# Patient Record
Sex: Male | Born: 1965 | Race: White | Hispanic: No | Marital: Married | Smoking: Never smoker
Health system: Southern US, Community
[De-identification: ages and names within clinical notes are randomized; demographics above are authoritative.]

## PROBLEM LIST (undated history)

## (undated) DIAGNOSIS — K8020 Calculus of gallbladder without cholecystitis without obstruction: Secondary | ICD-10-CM

## (undated) DIAGNOSIS — M1990 Unspecified osteoarthritis, unspecified site: Secondary | ICD-10-CM

## (undated) DIAGNOSIS — K219 Gastro-esophageal reflux disease without esophagitis: Secondary | ICD-10-CM

## (undated) DIAGNOSIS — I10 Essential (primary) hypertension: Secondary | ICD-10-CM

## (undated) HISTORY — PX: OTHER SURGICAL HISTORY: SHX169

## (undated) HISTORY — DX: Calculus of gallbladder without cholecystitis without obstruction: K80.20

## (undated) HISTORY — PX: HERNIA REPAIR: SHX51

---

## 2000-01-26 ENCOUNTER — Emergency Department (HOSPITAL_COMMUNITY): Admission: EM | Admit: 2000-01-26 | Discharge: 2000-01-26 | Payer: Self-pay | Admitting: Emergency Medicine

## 2000-01-26 ENCOUNTER — Encounter: Payer: Self-pay | Admitting: Emergency Medicine

## 2001-11-25 ENCOUNTER — Emergency Department (HOSPITAL_COMMUNITY): Admission: EM | Admit: 2001-11-25 | Discharge: 2001-11-25 | Payer: Self-pay | Admitting: Unknown Physician Specialty

## 2013-12-20 ENCOUNTER — Emergency Department (HOSPITAL_COMMUNITY)
Admission: EM | Admit: 2013-12-20 | Discharge: 2013-12-20 | Disposition: A | Discharge status: Home / Self Care | Payer: Medicaid Other | Attending: Emergency Medicine | Admitting: Emergency Medicine

## 2013-12-20 ENCOUNTER — Encounter (HOSPITAL_COMMUNITY): Payer: Self-pay | Admitting: Emergency Medicine

## 2013-12-20 DIAGNOSIS — Y9389 Activity, other specified: Secondary | ICD-10-CM | POA: Insufficient documentation

## 2013-12-20 DIAGNOSIS — T148XXA Other injury of unspecified body region, initial encounter: Secondary | ICD-10-CM

## 2013-12-20 DIAGNOSIS — X500XXA Overexertion from strenuous movement or load, initial encounter: Secondary | ICD-10-CM | POA: Insufficient documentation

## 2013-12-20 DIAGNOSIS — IMO0002 Reserved for concepts with insufficient information to code with codable children: Secondary | ICD-10-CM | POA: Insufficient documentation

## 2013-12-20 DIAGNOSIS — I10 Essential (primary) hypertension: Secondary | ICD-10-CM | POA: Insufficient documentation

## 2013-12-20 DIAGNOSIS — Y929 Unspecified place or not applicable: Secondary | ICD-10-CM | POA: Insufficient documentation

## 2013-12-20 HISTORY — DX: Essential (primary) hypertension: I10

## 2013-12-20 MED ORDER — IBUPROFEN 800 MG PO TABS: 800.0000 mg | ORAL_TABLET | Freq: Three times a day (TID) | ORAL | Status: DC

## 2013-12-20 MED ORDER — OXYCODONE-ACETAMINOPHEN 5-325 MG PO TABS: 1.0000 | ORAL_TABLET | ORAL | Status: DC | PRN

## 2013-12-20 MED ORDER — IBUPROFEN 800 MG PO TABS
800.0000 mg | ORAL_TABLET | Freq: Once | ORAL | Status: AC
  Administered 2013-12-20: 800 mg via ORAL
  Filled 2013-12-20: qty 1

## 2013-12-20 MED ORDER — OXYCODONE-ACETAMINOPHEN 5-325 MG PO TABS
2.0000 | ORAL_TABLET | Freq: Once | ORAL | Status: AC
  Administered 2013-12-20: 2 via ORAL
  Filled 2013-12-20: qty 2

## 2013-12-20 NOTE — ED Notes (Signed)
Pt was pushing his truck today and injury right lower leg, "heard a pop", thinks it may be broken.

## 2013-12-20 NOTE — ED Provider Notes (Signed)
CSN: [id number]     Arrival date & time 12/20/13  0709 History   First MD Initiated Contact with Patient 12/20/13 0827     Chief Complaint  Patient presents with  . Leg Injury   (Consider location/radiation/quality/duration/timing/severity/associated sxs/prior Treatment) HPI Comments: Mark Warner is a 48 y.o. male who presents to the Emergency Department complaining of sudden onset of pain to the right lower leg that began just prior to ED arrival.  Patient states that he was pushing his truck and felt a sharp pain and a "pop" to his right calf.  Reports pain and inability to bear weight on his right leg.  Pain is worse with dorsiflexion of the right foot.  He denies fall, redness, numbness, weakness, knee or hip pain.    The history is provided by the patient.    Past Medical History  Diagnosis Date  . Hypertension    Past Surgical History  Procedure Laterality Date  . Hernia repair     No family history on file. History  Substance Use Topics  . Smoking status: Never Smoker   . Smokeless tobacco: Not on file  . Alcohol Use: No    Review of Systems  Constitutional: Negative for fever and chills.  Gastrointestinal: Negative for nausea, vomiting and abdominal pain.  Genitourinary: Negative for dysuria and difficulty urinating.  Musculoskeletal: Positive for arthralgias, gait problem and myalgias. Negative for back pain and joint swelling.  Skin: Negative for color change and wound.  Neurological: Negative for dizziness, syncope, speech difficulty, weakness and light-headedness.  All other systems reviewed and are negative.    Allergies  Review of patient's allergies indicates no known allergies.  Home Medications  No current outpatient prescriptions on file. BP 157/89  Pulse 68  Temp(Src) 98.3 F (36.8 C) (Oral)  Resp 18  Ht 5\' 9"  (1.753 m)  Wt 240 lb (108.863 kg)  BMI 35.43 kg/m2  SpO2 98%   Physical Exam  Nursing note and vitals reviewed. Constitutional:  He is oriented to person, place, and time. He appears well-developed and well-nourished. No distress.  HENT:  Head: Normocephalic and atraumatic.  Cardiovascular: Normal rate, regular rhythm, normal heart sounds and intact distal pulses.   No murmur heard. Pulmonary/Chest: Effort normal and breath sounds normal. No respiratory distress. He exhibits no tenderness.  Abdominal: Soft. Bowel sounds are normal. He exhibits no distension. There is no tenderness.  Musculoskeletal: He exhibits edema and tenderness.       Right lower leg: He exhibits tenderness and swelling. He exhibits no bony tenderness, no deformity and no laceration.       Legs: ttp of the medial aspect of the right lower leg.  Mild edema present.  Thompson test negative.  DP and PT pulse are brisk, distal sensation intact.  CR< 3 sec  Neurological: He is alert and oriented to person, place, and time. He exhibits normal muscle tone. Coordination normal.  Skin: Skin is warm and dry. No rash noted.    ED Course  Procedures (including critical care time) Labs Review Labs Reviewed - No data to display Imaging Review No results found.  EKG Interpretation   None       MDM    Localized ttp of the right medial calf.  Probable muscle injury of the soleus vs gastrocnemius.  Negative Thompson test, no step off deformity or tenderness of the Achilles tendon.  9:29 AM Consulted Dr. Romeo Apple, advised to give pt crutches, and he will see him in his  office for follow-up.  Discussed plan with patient, agrees to f/u.  Will prescribe ibuprofen and percocet #24  Nivaan Dicenzo L. Trisha Mangleriplett, PA-C 12/21/13 [phone]

## 2013-12-22 NOTE — ED Provider Notes (Signed)
Medical screening examination/treatment/procedure(s) were performed by non-physician practitioner and as supervising physician I was immediately available for consultation/collaboration.  EKG Interpretation   None         Adelyne Marchese L Brycelyn Gambino, MD 12/22/13 0832 

## 2013-12-25 ENCOUNTER — Telehealth: Payer: Self-pay | Admitting: Orthopedic Surgery

## 2013-12-25 NOTE — Telephone Encounter (Addendum)
Patient (wife) called to request appointment following Emergency Room visit at [facility] on 12/20/13 for problem of leg injury, states from pushing a vehicle. Reviewed appointment information and insurance requirements, which include referral from primary care physician (Dr [facility]ard at [facility]).  Will contact Primary care to request referral appointment.

## 2014-01-03 NOTE — Telephone Encounter (Signed)
Called patient to follow up; spoke with patient/wife; states [place] insurance "is messed up"; patient is working with [employer] and insurance, as well as primary care office on card, which [facility] (patient has never been seen at this office; also, patient has been told [facility], based on their location.  States will follow up with us after the insurance "has it straightened out."

## 2017-11-24 ENCOUNTER — Encounter (HOSPITAL_COMMUNITY): Payer: Self-pay

## 2017-11-24 ENCOUNTER — Emergency Department (HOSPITAL_COMMUNITY)
Admission: EM | Admit: 2017-11-24 | Discharge: 2017-11-24 | Disposition: A | Discharge status: Home / Self Care | Payer: Self-pay | Attending: Emergency Medicine | Admitting: Emergency Medicine

## 2017-11-24 ENCOUNTER — Other Ambulatory Visit: Payer: Self-pay

## 2017-11-24 DIAGNOSIS — I10 Essential (primary) hypertension: Secondary | ICD-10-CM | POA: Insufficient documentation

## 2017-11-24 DIAGNOSIS — K0889 Other specified disorders of teeth and supporting structures: Secondary | ICD-10-CM | POA: Insufficient documentation

## 2017-11-24 MED ORDER — IBUPROFEN 800 MG PO TABS
800.0000 mg | ORAL_TABLET | Freq: Once | ORAL | Status: AC
  Administered 2017-11-24: 800 mg via ORAL
  Filled 2017-11-24: qty 1

## 2017-11-24 MED ORDER — PENICILLIN V POTASSIUM 500 MG PO TABS: 500.0000 mg | ORAL_TABLET | Freq: Four times a day (QID) | ORAL | 0 refills | Status: AC

## 2017-11-24 MED ORDER — PENICILLIN V POTASSIUM 250 MG PO TABS
500.0000 mg | ORAL_TABLET | Freq: Once | ORAL | Status: AC
  Administered 2017-11-24: 500 mg via ORAL
  Filled 2017-11-24: qty 2

## 2017-11-24 MED ORDER — IBUPROFEN 600 MG PO TABS: 600.0000 mg | ORAL_TABLET | Freq: Four times a day (QID) | ORAL | 0 refills | Status: DC | PRN

## 2017-11-24 NOTE — ED Triage Notes (Signed)
Pt arrives from home c/o left dental pain radiating to his head. States it is an 8 (0-10 scale). Pt reports legs were shaky earlier and states the pain is worse while lying down.

## 2017-11-24 NOTE — ED Provider Notes (Signed)
[facility] EMERGENCY DEPARTMENT Provider Note   CSN: [id number] Arrival date & time: 11/24/17  0516     History   Chief Complaint Chief Complaint  Patient presents with  . Dental Pain    HPI Mark BilletRobert J Warner is a 51 y.o. male.  The history is provided by the patient and the spouse.  Dental Pain   This is a new problem. The current episode started 6 to 12 hours ago. The problem occurs constantly. The problem has been gradually worsening. The pain is moderate.   Patient reports onset of left lower jaw dental pain several hours ago Since that time the pain has been shooting up in the left side of his face and head He reports he took a Vicodin as well as a shot of bourbon for the pain but this did not help After taking the Vicodin and bourbon, he started feeling "shaky "in his legs and said he felt his whole body was cold Says he never takes pain medicine  Currently says he still has pain in his jaw as well as head There is no fever/vomiting,no visual changes, no chest pain, no shortness of breath Past Medical History:  Diagnosis Date  . Hypertension     There are no active problems to display for this patient.   Past Surgical History:  Procedure Laterality Date  . HERNIA REPAIR         Home Medications    Prior to Admission medications   Medication Sig Start Date End Date Taking? Authorizing Provider  ibuprofen (ADVIL,MOTRIN) 600 MG tablet Take 1 tablet (600 mg total) by mouth every 6 (six) hours as needed. 11/24/17   Zadie RhineWickline, Narciso Stoutenburg, MD  penicillin v potassium (VEETID) 500 MG tablet Take 1 tablet (500 mg total) by mouth 4 (four) times daily for 7 days. 11/24/17 12/01/17  Zadie RhineWickline, Anayia Eugene, MD    Family History No family history on file.  Social History Social History   Tobacco Use  . Smoking status: Never Smoker  Substance Use Topics  . Alcohol use: No  . Drug use: No     Allergies   Patient has no known allergies.   Review of Systems Review of  Systems  Constitutional: Negative for fever.  HENT: Positive for dental problem. Negative for trouble swallowing.   Eyes: Negative for visual disturbance.  Respiratory: Negative for shortness of breath.   Cardiovascular: Negative for chest pain.     Physical Exam Updated Vital Signs BP (!) 172/96 (BP Location: Right Arm)   Pulse 65   Temp 97.8 F (36.6 C) (Oral)   Resp 16   Ht 1.753 m (5\' 9" )   Wt 108.9 kg (240 lb)   SpO2 96%   BMI 35.44 kg/m   Physical Exam CONSTITUTIONAL: Well developed/well nourished HEAD AND FACE: Normocephalic/atraumatic EYES: EOMI/PERRL, no proptosis ENMT: Mucous membranes moist.  Poor dentition.  No trismus.  No focal abscess noted.  Tenderness to left lower teeth, no gingival edema bilateral TMs clear and intact NECK: supple no meningeal signs CV: S1/S2 noted, no murmurs/rubs/gallops noted LUNGS: Lungs are clear to auscultation bilaterally, no apparent distress ABDOMEN: soft, nontender, no rebound or guarding NEURO: Pt is awake/alert, moves all extremitiesx4 EXTREMITIES:full ROM SKIN: warm, color normal   ED Treatments / Results  Labs (all labs ordered are listed, but only abnormal results are displayed) Labs Reviewed - No data to display  EKG  EKG Interpretation None       Radiology No results found.  Procedures  Procedures (including critical care time)  Medications Ordered in ED Medications  penicillin v potassium (VEETID) tablet 500 mg (not administered)  ibuprofen (ADVIL,MOTRIN) tablet 800 mg (not administered)     Initial Impression / Assessment and Plan / ED Course  I have reviewed the triage vital signs and the nursing notes.      Patient here with dental pain that started last night Suspect his episode of feeling cold and "shaky legs "were related to Vicodin and bourbon Start penicillin and will start ibuprofen and referred to dentistry  Final Clinical Impressions(s) / ED Diagnoses   Final diagnoses:  Pain,  dental    ED Discharge Orders        Ordered    ibuprofen (ADVIL,MOTRIN) 600 MG tablet  Every 6 hours PRN     11/24/17 0614    penicillin v potassium (VEETID) 500 MG tablet  4 times daily     11/24/17 [id number]       Zadie RhineWickline, Kimothy Kishimoto, MD 11/24/17 [phone]

## 2018-03-23 ENCOUNTER — Emergency Department (HOSPITAL_COMMUNITY)
Admission: EM | Admit: 2018-03-23 | Discharge: 2018-03-23 | Disposition: A | Discharge status: Home / Self Care | Payer: Self-pay | Attending: Emergency Medicine | Admitting: Emergency Medicine

## 2018-03-23 ENCOUNTER — Other Ambulatory Visit: Payer: Self-pay

## 2018-03-23 ENCOUNTER — Encounter (HOSPITAL_COMMUNITY): Payer: Self-pay | Admitting: Emergency Medicine

## 2018-03-23 ENCOUNTER — Emergency Department (HOSPITAL_COMMUNITY): Payer: Self-pay

## 2018-03-23 DIAGNOSIS — I10 Essential (primary) hypertension: Secondary | ICD-10-CM | POA: Insufficient documentation

## 2018-03-23 DIAGNOSIS — Z79899 Other long term (current) drug therapy: Secondary | ICD-10-CM | POA: Insufficient documentation

## 2018-03-23 DIAGNOSIS — N1330 Unspecified hydronephrosis: Secondary | ICD-10-CM | POA: Insufficient documentation

## 2018-03-23 DIAGNOSIS — K8020 Calculus of gallbladder without cholecystitis without obstruction: Secondary | ICD-10-CM | POA: Insufficient documentation

## 2018-03-23 DIAGNOSIS — E876 Hypokalemia: Secondary | ICD-10-CM | POA: Insufficient documentation

## 2018-03-23 DIAGNOSIS — K5790 Diverticulosis of intestine, part unspecified, without perforation or abscess without bleeding: Secondary | ICD-10-CM | POA: Insufficient documentation

## 2018-03-23 LAB — URINALYSIS, ROUTINE W REFLEX MICROSCOPIC
BACTERIA UA: NONE SEEN
BILIRUBIN URINE: NEGATIVE
Glucose, UA: NEGATIVE mg/dL
KETONES UR: NEGATIVE mg/dL
LEUKOCYTES UA: NEGATIVE
Nitrite: NEGATIVE
Protein, ur: NEGATIVE mg/dL
Specific Gravity, Urine: 1.009 (ref 1.005–1.030)
Squamous Epithelial / LPF: NONE SEEN
pH: 6 (ref 5.0–8.0)

## 2018-03-23 LAB — CBC WITH DIFFERENTIAL/PLATELET
BASOS ABS: 0 10*3/uL (ref 0.0–0.1)
Basophils Relative: 0 %
Eosinophils Absolute: 0.2 10*3/uL (ref 0.0–0.7)
Eosinophils Relative: 2 %
HCT: 44.8 % (ref 39.0–52.0)
HEMOGLOBIN: 14.9 g/dL (ref 13.0–17.0)
LYMPHS ABS: 2 10*3/uL (ref 0.7–4.0)
LYMPHS PCT: 22 %
MCH: 29 pg (ref 26.0–34.0)
MCHC: 33.3 g/dL (ref 30.0–36.0)
MCV: 87.2 fL (ref 78.0–100.0)
Monocytes Absolute: 0.8 10*3/uL (ref 0.1–1.0)
Monocytes Relative: 8 %
NEUTROS PCT: 68 %
Neutro Abs: 6 10*3/uL (ref 1.7–7.7)
Platelets: 134 10*3/uL — ABNORMAL LOW (ref 150–400)
RBC: 5.14 MIL/uL (ref 4.22–5.81)
RDW: 13.1 % (ref 11.5–15.5)
WBC: 8.9 10*3/uL (ref 4.0–10.5)

## 2018-03-23 LAB — COMPREHENSIVE METABOLIC PANEL
ALBUMIN: 4.1 g/dL (ref 3.5–5.0)
ALT: 27 U/L (ref 17–63)
ANION GAP: 7 (ref 5–15)
AST: 23 U/L (ref 15–41)
Alkaline Phosphatase: 71 U/L (ref 38–126)
BUN: 16 mg/dL (ref 6–20)
CO2: 26 mmol/L (ref 22–32)
Calcium: 9.2 mg/dL (ref 8.9–10.3)
Chloride: 104 mmol/L (ref 101–111)
Creatinine, Ser: 0.96 mg/dL (ref 0.61–1.24)
GFR calc non Af Amer: >60 mL/min (ref >60)
GLUCOSE: 168 mg/dL — AB (ref 65–99)
POTASSIUM: 3.3 mmol/L — AB (ref 3.5–5.1)
SODIUM: 137 mmol/L (ref 135–145)
Total Bilirubin: 0.5 mg/dL (ref 0.3–1.2)
Total Protein: 7.3 g/dL (ref 6.5–8.1)

## 2018-03-23 LAB — TROPONIN I

## 2018-03-23 LAB — LIPASE, BLOOD: Lipase: 29 U/L (ref 11–51)

## 2018-03-23 MED ORDER — ONDANSETRON HCL 4 MG/2ML IJ SOLN
4.0000 mg | Freq: Once | INTRAMUSCULAR | Status: AC
  Administered 2018-03-23: 4 mg via INTRAVENOUS
  Filled 2018-03-23: qty 2

## 2018-03-23 MED ORDER — KETOROLAC TROMETHAMINE 30 MG/ML IJ SOLN
30.0000 mg | Freq: Once | INTRAMUSCULAR | Status: AC
  Administered 2018-03-23: 30 mg via INTRAVENOUS
  Filled 2018-03-23: qty 1

## 2018-03-23 MED ORDER — POTASSIUM CHLORIDE CRYS ER 20 MEQ PO TBCR: 20.0000 meq | EXTENDED_RELEASE_TABLET | Freq: Two times a day (BID) | ORAL | 0 refills | Status: DC

## 2018-03-23 NOTE — ED Triage Notes (Signed)
Pt c/o left lower abd pain that radiates to testicles at times. Pt states pain woke him up out of sleep.

## 2018-03-23 NOTE — ED Provider Notes (Signed)
[facility] EMERGENCY DEPARTMENT Provider Note   CSN: [id number] Arrival date & time: 03/23/18  0428  Time seen 04:45 AM   History   Chief Complaint Chief Complaint  Patient presents with  . Flank Pain    HPI Albina BilletRobert J Windle is a 52 y.o. male.  HPI patient states he was sleeping tonight and was having a lot of heartburn and points to the center of his chest.  He states he ate pizza and cookies tonight which he does not normally eat.  He states about 2 AM he was awakened with severe pain in his left lateral abdomen that radiated into his left testicle that he described as a pressure feeling in the testicle.  The pain in the eighth his abdomen was sharp and continuous.  He felt the urge to urinate continuously.  He denies any hematuria.  He states movement makes the pain worse, nothing made it feel better.  He does state however when he arrived in the ED the bad pain eased up and he now just has mild discomfort.  He denies nausea or vomiting.  He states he has been having this pain before and states he had the pain when he was seen in the ED in December however it looks to get that noted states he had dental pain.  PCP Patient, No Pcp Per   Past Medical History:  Diagnosis Date  . Hypertension     There are no active problems to display for this patient.   Past Surgical History:  Procedure Laterality Date  . HERNIA REPAIR          Home Medications    Prior to Admission medications   Medication Sig Start Date End Date Taking? Authorizing Provider  ibuprofen (ADVIL,MOTRIN) 600 MG tablet Take 1 tablet (600 mg total) by mouth every 6 (six) hours as needed. 11/24/17   Zadie RhineWickline, Donald, MD  potassium chloride SA (K-DUR,KLOR-CON) 20 MEQ tablet Take 1 tablet (20 mEq total) by mouth 2 (two) times daily. 03/23/18   Devoria AlbeKnapp, Vikkie Goeden, MD    Family History No family history on file.  Social History Social History   Tobacco Use  . Smoking status: Never Smoker  . Smokeless tobacco:  Never Used  Substance Use Topics  . Alcohol use: No  . Drug use: No  employed Lives with spouse   Allergies   Patient has no known allergies.   Review of Systems Review of Systems  All other systems reviewed and are negative.    Physical Exam Updated Vital Signs BP (!) 155/93 (BP Location: Right Arm)   Pulse 60   Temp (!) 97.4 F (36.3 C) (Oral)   Resp 16   Ht 5\' 9"  (1.753 m)   Wt 104.3 kg (230 lb)   SpO2 98%   BMI 33.97 kg/m   Physical Exam  Constitutional: He is oriented to person, place, and time. He appears well-developed and well-nourished.  Non-toxic appearance. He does not appear ill. No distress.  HENT:  Head: Normocephalic and atraumatic.  Right Ear: External ear normal.  Left Ear: External ear normal.  Nose: Nose normal. No mucosal edema or rhinorrhea.  Mouth/Throat: Oropharynx is clear and moist and mucous membranes are normal. No dental abscesses or uvula swelling.  Eyes: Pupils are equal, round, and reactive to light. Conjunctivae and EOM are normal.  Neck: Normal range of motion and full passive range of motion without pain. Neck supple.  Cardiovascular: Normal rate, regular rhythm and normal heart sounds. Exam  reveals no gallop and no friction rub.  No murmur heard. Pulmonary/Chest: Effort normal and breath sounds normal. No respiratory distress. He has no wheezes. He has no rhonchi. He has no rales. He exhibits no tenderness and no crepitus.  Abdominal: Soft. Normal appearance and bowel sounds are normal. He exhibits no distension. There is no tenderness. There is no rebound and no guarding.    Area of pain noted  Musculoskeletal: Normal range of motion. He exhibits no edema or tenderness.  Moves all extremities well.   Neurological: He is alert and oriented to person, place, and time. He has normal strength. No cranial nerve deficit.  Skin: Skin is warm, dry and intact. No rash noted. No erythema. No pallor.  Psychiatric: He has a normal mood and  affect. His speech is normal and behavior is normal. His mood appears not anxious.  Nursing note and vitals reviewed.    ED Treatments / Results  Labs (all labs ordered are listed, but only abnormal results are displayed) Results for orders placed or performed during the hospital encounter of 03/23/18  Urinalysis, Routine w reflex microscopic- may I&O cath if menses  Result Value Ref Range   Color, Urine YELLOW YELLOW   APPearance CLEAR CLEAR   Specific Gravity, Urine 1.009 1.005 - 1.030   pH 6.0 5.0 - 8.0   Glucose, UA NEGATIVE NEGATIVE mg/dL   Hgb urine dipstick LARGE (A) NEGATIVE   Bilirubin Urine NEGATIVE NEGATIVE   Ketones, ur NEGATIVE NEGATIVE mg/dL   Protein, ur NEGATIVE NEGATIVE mg/dL   Nitrite NEGATIVE NEGATIVE   Leukocytes, UA NEGATIVE NEGATIVE   RBC / HPF 6-30 0 - 5 RBC/hpf   WBC, UA 0-5 0 - 5 WBC/hpf   Bacteria, UA NONE SEEN NONE SEEN   Squamous Epithelial / LPF NONE SEEN NONE SEEN   Mucus PRESENT   Comprehensive metabolic panel  Result Value Ref Range   Sodium 137 135 - 145 mmol/L   Potassium 3.3 (L) 3.5 - 5.1 mmol/L   Chloride 104 101 - 111 mmol/L   CO2 26 22 - 32 mmol/L   Glucose, Bld 168 (H) 65 - 99 mg/dL   BUN 16 6 - 20 mg/dL   Creatinine, Ser 2.95 0.61 - 1.24 mg/dL   Calcium 9.2 8.9 - 62.1 mg/dL   Total Protein 7.3 6.5 - 8.1 g/dL   Albumin 4.1 3.5 - 5.0 g/dL   AST 23 15 - 41 U/L   ALT 27 17 - 63 U/L   Alkaline Phosphatase 71 38 - 126 U/L   Total Bilirubin 0.5 0.3 - 1.2 mg/dL   GFR calc non Af Amer >60 >60 mL/min   GFR calc Af Amer >60 >60 mL/min   Anion gap 7 5 - 15  Lipase, blood  Result Value Ref Range   Lipase 29 11 - 51 U/L  Troponin I  Result Value Ref Range   Troponin I <0.03 <0.03 ng/mL  CBC with Differential  Result Value Ref Range   WBC 8.9 4.0 - 10.5 K/uL   RBC 5.14 4.22 - 5.81 MIL/uL   Hemoglobin 14.9 13.0 - 17.0 g/dL   HCT 30.8 65.7 - 84.6 %   MCV 87.2 78.0 - 100.0 fL   MCH 29.0 26.0 - 34.0 pg   MCHC 33.3 30.0 - 36.0 g/dL    RDW 96.2 95.2 - 84.1 %   Platelets 134 (L) 150 - 400 K/uL   Neutrophils Relative % 68 %   Neutro Abs 6.0 1.7 -  7.7 K/uL   Lymphocytes Relative 22 %   Lymphs Abs 2.0 0.7 - 4.0 K/uL   Monocytes Relative 8 %   Monocytes Absolute 0.8 0.1 - 1.0 K/uL   Eosinophils Relative 2 %   Eosinophils Absolute 0.2 0.0 - 0.7 K/uL   Basophils Relative 0 %   Basophils Absolute 0.0 0.0 - 0.1 K/uL   Laboratory interpretation all normal except hematuria and mild hypokalemia    EKG EKG Interpretation  Date/Time:  Wednesday March 23 2018 05:16:11 EDT Ventricular Rate:  54 PR Interval:    QRS Duration: 100 QT Interval:  455 QTC Calculation: 432 R Axis:   75 Text Interpretation:  Sinus bradycardia U waves present Otherwise within normal limits No old tracing to compare Confirmed by Devoria Albe ([id number]) on 03/23/2018 5:24:28 AM   Radiology Ct Renal Stone Study  Result Date: 03/23/2018 CLINICAL DATA:  Left flank pain. EXAM: CT ABDOMEN AND PELVIS WITHOUT CONTRAST TECHNIQUE: Multidetector CT imaging of the abdomen and pelvis was performed following the standard protocol without IV contrast. COMPARISON:  None. FINDINGS: Lower chest: Minimal lingular atelectasis.  No consolidation. Hepatobiliary: Decreased hepatic density consistent with steatosis. Focal fatty sparing adjacent the gallbladder fossa. 2 peripherally calcified gallstones within the gallbladder without pericholecystic inflammation or gallbladder wall thickening. No biliary dilatation. Pancreas: Few calcifications in the distal pancreatic body and tail. No ductal dilatation or inflammation. Spleen: Mild splenomegaly with spleen measuring 15.3 cm AP. Adrenals/Urinary Tract: Mild left adrenal thickening. No adrenal nodule. Mild left hydroureteronephrosis, the ureter is dilated to the urinary bladder. No calcified stone or cause for obstruction. Mild left perinephric edema. No right hydronephrosis or hydroureter. No nephrolithiasis. Urinary bladder is  partially distended without stone or wall thickening. No urethral stones. Stomach/Bowel: Stomach distended with fluid/ingested contents. No gastric wall thickening. No bowel obstruction, inflammatory change or wall thickening. Air and possible prior enteric contrast in the appendix without appendicitis. Moderate proximal colonic stool burden. Mild diverticulosis of the distal colon without diverticulitis. Vascular/Lymphatic: Mild aortic atherosclerosis without aneurysm. Few prominent portacaval nodes are likely reactive. There is a 1.5 x 2.0 cm soft tissue nodule in the left pericolic gutter, axial image 39 series 2. No retroperitoneal or pelvic adenopathy. Reproductive: Prostate is unremarkable. Other: Small fat containing umbilical hernia. Fat in both inguinal canals. No ascites or free air. Musculoskeletal: There are no acute or suspicious osseous abnormalities. IMPRESSION: 1. Mild left hydroureteronephrosis without visualized stone or cause for obstruction. Findings may be secondary to non radiopaque stone, recently passed stone or pyelonephritis. 2. Well defined soft tissue nodule in the left pericolic gutter measuring 1.5 x 2 cm. This may be a splenule as this is similar density to the spleen versus an enlarged lymph node. No other adenopathy in the abdomen or pelvis. No prior exams available for comparison. Recommend clinical and laboratory evaluation for lymphoproliferative disorder. In the absence of malignancy history, recommend follow-up CT in 3 months. 3. Incidental findings of cholelithiasis. Mild colonic diverticulosis. No diverticulitis. Aortic Atherosclerosis (ICD10-I70.0). Electronically Signed   By: Rubye Oaks M.D.   On: 03/23/2018 06:03    Procedures Procedures (including critical care time)  Medications Ordered in ED Medications  ondansetron (ZOFRAN) injection 4 mg (4 mg Intravenous Given 03/23/18 0510)  ketorolac (TORADOL) 30 MG/ML injection 30 mg (30 mg Intravenous Given 03/23/18  0510)     Initial Impression / Assessment and Plan / ED Course  I have reviewed the triage vital signs and the nursing notes.  Pertinent labs & imaging  results that were available during my care of the patient were reviewed by me and considered in my medical decision making (see chart for details).     Patient was given IV Zofran and ketorolac for his presumed kidney stone pain.  I had a laboratory testing to make sure he is not having inflammation of his gallbladder with his complaints of heartburn and also evaluate his heart.  EKG was also done.  At time of discharge she states his pain is gone.  We discussed his CT results.  It appears he has passed a kidney stone however there could be some scar tissue in the distal ureter that is causing the mild hydronephrosis.  We discussed the left gutter lymphadenopathy and it was just something to be aware of.  We also talked that he has gallstones.  At this point he does not want to talk to surgeon about having his gallbladder removed.  He states he will just avoid the foods that make him feel bad.  We also discussed he had diverticulosis and that he should start eating a high fiber diet.  We had already discussed earlier that he needs to drink sports drinks when he is working or sweating and this will help prevent him forming kidney stones.  Patient was discharged home with some potassium pills.  Final Clinical Impressions(s) / ED Diagnoses   Final diagnoses:  Gallstones  Diverticulosis  Hydronephrosis, unspecified hydronephrosis type  Hypokalemia    ED Discharge Orders        Ordered    potassium chloride SA (K-DUR,KLOR-CON) 20 MEQ tablet  2 times daily     03/23/18 0626      Plan discharge  Devoria Albe, MD, Concha Pyo, MD 03/23/18 [phone]

## 2018-03-23 NOTE — Discharge Instructions (Addendum)
The tube that drains her left kidney is slightly enlarged consistent with you having recently passed a kidney stone, or due to a slight blockage of the urine draining on that side.  Please follow-up with [facility].  You also do have gallstones.  You can avoid the things in your diet that make you have episodes of discomfort and heartburn.  However if it gets worse you might consider talking to a surgeon about having your gallbladder removed.  You also have diverticulosis, you should try to eat a high-fiber diet.  Try to drink sports drinks when you are outside working in the heat or you are getting sweaty, this will help you prevent formation of kidney stones.

## 2018-04-07 ENCOUNTER — Encounter: Payer: Self-pay | Admitting: Family Medicine

## 2018-04-07 ENCOUNTER — Encounter: Payer: Self-pay | Admitting: *Deleted

## 2018-04-07 ENCOUNTER — Ambulatory Visit: Payer: Self-pay | Admitting: Family Medicine

## 2018-04-07 VITALS — BP 160/90 | HR 62 | Temp 98.3°F | Resp 14 | Ht 69.0 in | Wt 228.0 lb

## 2018-04-07 DIAGNOSIS — R030 Elevated blood-pressure reading, without diagnosis of hypertension: Secondary | ICD-10-CM

## 2018-04-07 DIAGNOSIS — K8020 Calculus of gallbladder without cholecystitis without obstruction: Secondary | ICD-10-CM

## 2018-04-07 DIAGNOSIS — R1900 Intra-abdominal and pelvic swelling, mass and lump, unspecified site: Secondary | ICD-10-CM

## 2018-04-07 DIAGNOSIS — Z125 Encounter for screening for malignant neoplasm of prostate: Secondary | ICD-10-CM

## 2018-04-07 DIAGNOSIS — Z7689 Persons encountering health services in other specified circumstances: Secondary | ICD-10-CM

## 2018-04-07 NOTE — Progress Notes (Signed)
Subjective:    Patient ID: Mark BilletRobert J Sabet, male    DOB: August 15, 1966, 52 y.o.   MRN: [id number]  HPI  Patient is a very pleasant 52 year old Caucasian male here today to establish care.  Recently went to the emergency room after he developed severe right upper quadrant and left upper quadrant abdominal pain.  CT scan renal stone protocol was obtained which revealed cephalic gallstones in the right upper quadrant.  It also revealed mild left hydronephrosis suggesting possibly recent past left-sided nephrolithiasis.  There was also the coincidental finding of a 1.5 cm x 2 cm mass in the left paracolic gutter concerning for a lymph node versus splenule.  He was discharged home it is referred here today for treatment.  His left upper quadrant abdominal pain has since subsided and has not returned.  However he continues to have episodes of right upper quadrant abdominal pain.  It begins right below his right breast and radiates into his right shoulder blade area.  It tends to last hours and occur without provocation.  It resolves without any specific intervention.  Symptoms are concerning for gallstones.  His blood pressure today is elevated at 160/90 however he states that he is nervous.  He has never been told he has high blood pressure.  He denies any chest pain shortness of breath or dyspnea on exertion.  He is overdue for a colonoscopy.  He is overdue for prostate cancer screening.  He is overdue for fasting lab work. No past medical history on file. Past Surgical History:  Procedure Laterality Date  . HERNIA REPAIR     He takes no medication.  He has no known drug allergies. Social History   Socioeconomic History  . Marital status: Married    Spouse name: Not on file  . Number of children: Not on file  . Years of education: Not on file  . Highest education level: Not on file  Occupational History  . Not on file  Social Needs  . Financial resource strain: Not on file  . Food insecurity:    Worry: Not on file    Inability: Not on file  . Transportation needs:    Medical: Not on file    Non-medical: Not on file  Tobacco Use  . Smoking status: Never Smoker  . Smokeless tobacco: Never Used  Substance and Sexual Activity  . Alcohol use: No  . Drug use: No  . Sexual activity: Yes  Lifestyle  . Physical activity:    Days per week: Not on file    Minutes per session: Not on file  . Stress: Not on file  Relationships  . Social connections:    Talks on phone: Not on file    Gets together: Not on file    Attends religious service: Not on file    Active member of club or organization: Not on file    Attends meetings of clubs or organizations: Not on file    Relationship status: Not on file  . Intimate partner violence:    Fear of current or ex partner: Not on file    Emotionally abused: Not on file    Physically abused: Not on file    Forced sexual activity: Not on file  Other Topics Concern  . Not on file  Social History Narrative  . Not on file   No family history on file. Patient is uncertain about family history.  He believes his father had high blood pressure and heart disease.  He is uncertain about his mother.  He denies any history of colon cancer or prostate cancer Review of Systems  Constitutional: Negative.  Negative for chills, diaphoresis, fatigue, fever and unexpected weight change.  All other systems reviewed and are negative.      Objective:   Physical Exam  Constitutional: He appears well-developed and well-nourished. No distress.  HENT:  Head: Normocephalic and atraumatic.  Mouth/Throat: Oropharynx is clear and moist.  Eyes: No scleral icterus.  Neck: Neck supple. No JVD present.  Cardiovascular: Normal rate, regular rhythm and normal heart sounds.  No murmur heard. Pulmonary/Chest: Effort normal and breath sounds normal. No respiratory distress. He has no wheezes. He has no rales. He exhibits no tenderness.  Abdominal: Soft. Bowel sounds  are normal. He exhibits no distension and no mass. There is no tenderness. There is no rebound and no guarding.  Lymphadenopathy:    He has no cervical adenopathy.  Skin: He is not diaphoretic.  Vitals reviewed.         Assessment & Plan:  Encounter to establish care with new doctor - Plan: CBC with Differential/Platelet, COMPLETE METABOLIC PANEL WITH GFR, Lipid panel, PSA  Calculus of gallbladder without cholecystitis without obstruction - Plan: Ambulatory referral to General Surgery  Abdominal mass, unspecified abdominal location  Single episode of elevated blood pressure - Plan: CBC with Differential/Platelet, COMPLETE METABOLIC PANEL WITH GFR, Lipid panel, PSA  Prostate cancer screening - Plan: PSA  Patient's right upper quadrant pain does sound consistent with cholelithiasis.  I will consult general surgery to discuss elective cholecystectomy.  We discussed reasons to return to the emergency room such as fever severe right upper quadrant pain, jaundice, etc.  Patient will check his blood pressure every day and bring values to me in 1 week to review.  If greater than 140/90, will start medication.  He will return fasting for a CBC, CMP, fasting lipid panel, and PSA.  Once we have found resolution for the cholelithiasis, I will have the patient see GI for a colonoscopy to complete colon cancer screening.  Regarding the mass seen in the left paracolic gutter that is 1.5 cm x 2 cm, we are uncertain what that mass is.  It could be a lymph node versus a splenule.  I will appreciate general surgery opinion of whether they can biopsy this or evaluate this when they are performing his laparoscopic surgery.  If not, we could repeat a CT scan in 3 months to ensure stability is recommended by radiology.  Await the results of surgical consultation first.

## 2018-04-11 ENCOUNTER — Other Ambulatory Visit: Payer: Self-pay | Admitting: General Surgery

## 2018-04-14 ENCOUNTER — Encounter (HOSPITAL_COMMUNITY): Payer: Self-pay | Admitting: *Deleted

## 2018-04-14 ENCOUNTER — Other Ambulatory Visit: Payer: Self-pay

## 2018-04-14 LAB — COMPLETE METABOLIC PANEL WITH GFR
AG Ratio: 1.8 (calc) (ref 1.0–2.5)
ALBUMIN MSPROF: 4.2 g/dL (ref 3.6–5.1)
ALT: 21 U/L (ref 9–46)
AST: 16 U/L (ref 10–35)
Alkaline phosphatase (APISO): 64 U/L (ref 40–115)
BUN: 13 mg/dL (ref 7–25)
CALCIUM: 9 mg/dL (ref 8.6–10.3)
CO2: 27 mmol/L (ref 20–32)
CREATININE: 1.03 mg/dL (ref 0.70–1.33)
Chloride: 107 mmol/L (ref 98–110)
GFR, EST NON AFRICAN AMERICAN: 84 mL/min/{1.73_m2} (ref >=60)
GFR, Est African American: 98 mL/min/{1.73_m2} (ref >=60)
GLUCOSE: 131 mg/dL — AB (ref 65–99)
Globulin: 2.3 g/dL (calc) (ref 1.9–3.7)
Potassium: 4 mmol/L (ref 3.5–5.3)
Sodium: 142 mmol/L (ref 135–146)
TOTAL PROTEIN: 6.5 g/dL (ref 6.1–8.1)
Total Bilirubin: 0.7 mg/dL (ref 0.2–1.2)

## 2018-04-14 LAB — CBC WITH DIFFERENTIAL/PLATELET
BASOS PCT: 0.8 %
Basophils Absolute: 59 cells/uL (ref 0–200)
EOS PCT: 2.4 %
Eosinophils Absolute: 178 cells/uL (ref 15–500)
HCT: 44.5 % (ref 38.5–50.0)
HEMOGLOBIN: 15.3 g/dL (ref 13.2–17.1)
Lymphs Abs: 2146 cells/uL (ref 850–3900)
MCH: 29.7 pg (ref 27.0–33.0)
MCHC: 34.4 g/dL (ref 32.0–36.0)
MCV: 86.4 fL (ref 80.0–100.0)
MONOS PCT: 8.7 %
MPV: 9.9 fL (ref 7.5–12.5)
NEUTROS ABS: 4373 {cells}/uL (ref 1500–7800)
Neutrophils Relative %: 59.1 %
Platelets: 146 10*3/uL (ref 140–400)
RBC: 5.15 10*6/uL (ref 4.20–5.80)
RDW: 12.9 % (ref 11.0–15.0)
Total Lymphocyte: 29 %
WBC mixed population: 644 cells/uL (ref 200–950)
WBC: 7.4 10*3/uL (ref 3.8–10.8)

## 2018-04-14 LAB — LIPID PANEL
CHOL/HDL RATIO: 3.6 (calc) (ref <5.0)
CHOLESTEROL: 125 mg/dL (ref <200)
HDL: 35 mg/dL — ABNORMAL LOW (ref >40)
LDL CHOLESTEROL (CALC): 72 mg/dL
Non-HDL Cholesterol (Calc): 90 mg/dL (calc) (ref <130)
Triglycerides: 97 mg/dL (ref <150)

## 2018-04-14 LAB — PSA: PSA: 0.8 ng/mL (ref <=4.0)

## 2018-04-15 LAB — TEST AUTHORIZATION

## 2018-04-16 LAB — HEMOGLOBIN A1C W/OUT EAG: HEMOGLOBIN A1C: 5.8 %{Hb} — AB (ref <5.7)

## 2018-04-17 NOTE — H&P (Signed)
[facility] Location: [facility] Patient #: [id number] DOB: 01-08-1966 Married / Language: English / Race: White Male       History of Present Illness        The patient is a 52 year old male who presents for evaluation of gall stones. This is a 52 year old man from [place] His wife is with him throughout the encounter. He is referred by Dr. Lynnea Ferrier for evaluation of symptomatic gallstones.      For several months she's been having intermittent episodes of postprandial right upper quadrant pain. This will occur every 3-4 days. It is clearly related to meals and worse after fatty foods. Last a few hours and then goes away. He gets nausea but does not vomit. Gets some diarrhea which resolves.      He went to the emergency department on April 10. Lab work was normal. Urinalysis showed microscopic hematuria. CT scan showed gallstones which were obvious but no inflammation. Mild left hydroureter but no stones found. They theorized that he may have passed a stone. There was also a well-defined 1.5 x 2.0 soft tissue nodule in the left paracolic gutter. This seems to be floating in the fatty tissue. They theorized this was a benign splenule or an enlarged lymph node. They recommended clinical correlation. Follow-up CT in 3 months was recommended. Lymphoproliferative disorder was mentioned in the diagnosis. There was no other adenopathy or mass within the abdomen. I discussed all of these lab and x-ray findings with him. He also has an incarcerated umbilical hernia      Past history significant for right inguinal hernia repair in [place]. No other medical problems. Takes no medications. BMI 33.6. Family history reveals mother had her gallbladder out. Father living without defined medical problems. Social history reveals married with 2 children that are grown. Lives in [place] Denies tobacco or alcohol. Works as a Curator.       I advised him to  undergo cholecystectomy and he completely agrees. I told him that we would repair his umbilical hernia primarily on the way out and that we would look in the left paracolic gutter and do an excisional biopsy of this mass if I could find it. I told him that it might be obscured and we might have to leave it alone and repeat CT in 3 months. He was comfortable with all of this. I have discussed indications, details, Mordecai Maes, numerous risk of the surgery with him. He is aware the risk of bleeding, infection, conversion to open laparotomy, recurrence of the hernia, bile leak, injury to adjacent organs of major reconstructive surgery and other unforeseen problems. He understands these issues well. All of his questions were answered. He agrees with this plan.     I told him that if we did not excise the left paracolic gutter mass that I would follow that with him until we ensure stability or wound up biopsying it.   Past Surgical History Open Inguinal Hernia Surgery  Right.  Diagnostic Studies History  Colonoscopy  never  Allergies  No Known Drug Allergies Allergies Reconciled   Medication History  No Current Medications Medications Reconciled  Social History  Alcohol use  Occasional alcohol use. Caffeine use  Carbonated beverages. No drug use  Tobacco use  Former smoker.  Family History  Alcohol Abuse  Brother, Father. Melanoma  Mother.  Other Problems  Inguinal Hernia     Review of Systems  General Present- Fatigue. Not Present- Appetite Loss, Chills, Fever, Night Sweats,  Weight Gain and Weight Loss. Skin Not Present- Change in Wart/Mole, Dryness, Hives, Jaundice, New Lesions, Non-Healing Wounds, Rash and Ulcer. HEENT Not Present- Earache, Hearing Loss, Hoarseness, Nose Bleed, Oral Ulcers, Ringing in the Ears, Seasonal Allergies, Sinus Pain, Sore Throat, Visual Disturbances, Wears glasses/contact lenses and Yellow Eyes. Respiratory Not Present- Bloody sputum,  Chronic Cough, Difficulty Breathing, Snoring and Wheezing. Breast Not Present- Breast Mass, Breast Pain, Nipple Discharge and Skin Changes. Cardiovascular Not Present- Chest Pain, Difficulty Breathing Lying Down, Leg Cramps, Palpitations, Rapid Heart Rate, Shortness of Breath and Swelling of Extremities. Gastrointestinal Present- Change in Bowel Habits and Nausea. Not Present- Abdominal Pain, Bloating, Bloody Stool, Chronic diarrhea, Constipation, Difficulty Swallowing, Excessive gas, Gets full quickly at meals, Hemorrhoids, Indigestion, Rectal Pain and Vomiting. Male Genitourinary Not Present- Blood in Urine, Change in Urinary Stream, Frequency, Impotence, Nocturia, Painful Urination, Urgency and Urine Leakage.  Vitals  Weight: 227.6 lb Height: 69in Body Surface Area: 2.18 m Body Mass Index: 33.61 kg/m  Temp.: 98.45F  Pulse: 88 (Regular)  BP: 142/84 (Sitting, Left Arm, Standard)    Physical Exam  General Mental Status-Alert. General Appearance-Consistent with stated age. Hydration-Well hydrated. Voice-Normal.  Head and Neck Head-normocephalic, atraumatic with no lesions or palpable masses. Trachea-midline. Thyroid Gland Characteristics - normal size and consistency. Note: No cervical or supraclavicular adenopathy. Trachea midline   Eye Eyeball - Bilateral-Extraocular movements intact. Sclera/Conjunctiva - Bilateral-No scleral icterus.  Chest and Lung Exam Chest and lung exam reveals -quiet, even and easy respiratory effort with no use of accessory muscles and on auscultation, normal breath sounds, no adventitious sounds and normal vocal resonance. Inspection Chest Wall - Normal. Back - normal.  Cardiovascular Cardiovascular examination reveals -normal heart sounds, regular rate and rhythm with no murmurs and normal pedal pulses bilaterally.  Abdomen Inspection Inspection of the abdomen reveals - No Hernias. Note: Incarcerated umbilical  hernia. The hernia sac is probably 3-4 cm. The defect is not detectable. Skin healthy Other than mild obesity his abdominal exam is benign. Perhaps slight subjective tenderness in the right upper quadrant. No mass. No organomegaly. No tenderness to percussion right costal margin. Skin - Scar - no surgical scars. Palpation/Percussion Palpation and Percussion of the abdomen reveal - Soft, Non Tender, No Rebound tenderness, No Rigidity (guarding) and No hepatosplenomegaly. Auscultation Auscultation of the abdomen reveals - Bowel sounds normal.  Male Genitourinary Note: Well-healed right groin scar. No recurrent hernia. No inguinal adenopathy on either side.   Neurologic Neurologic evaluation reveals -alert and oriented x 3 with no impairment of recent or remote memory. Mental Status-Normal.  Musculoskeletal Normal Exam - Left-Upper Extremity Strength Normal and Lower Extremity Strength Normal. Normal Exam - Right-Upper Extremity Strength Normal and Lower Extremity Strength Normal.  Lymphatic Head & Neck  General Head & Neck Lymphatics: Bilateral - Description - Normal. Axillary  General Axillary Region: Bilateral - Description - Normal. Tenderness - Non Tender. Femoral & Inguinal  Generalized Femoral & Inguinal Lymphatics: Bilateral - Description - Normal. Tenderness - Non Tender.    Assessment & Plan  GALL STONES (K80.20)  You have been having episodes of right upper quadrant pain and nausea after meals for a few months When you went to the emergency room your CAT scan showed obvious gallstones and these are almost certainly the cause of your right upper quadrant pain Your lab work was normal These attacks of gallbladder pain will almost certainly continue until something is done. You did have a single episode of left upper quadrant pain and a little  bit of blood in your urine and you may have passed a kidney stone. Nothing further needs to be done at this point  You  also have an incarcerated umbilical hernia which we can repair at the time of the surgery since we will be going through that area anyway.  you also had a 2.0 cm solitary smooth nodule in the left abdomen behind the left colon. This looks like an ectopic spleen or a solitary lymph node. We would try to find this and remove it at the time of your gallbladder surgery; it is also quite possible that we would not see this If we do not find this then that we would do another CT scan in 3 or 4 months with the follow-up that this would need to have a biopsy done if it enlarges, and leave it alone if it stays the same time size  you will be scheduled for laparoscopic cholecystectomy with cholangiogram, possible open cholecystectomy, umbilical hernia repair, and possible excision left paracolic gutter nodule I discussed the indications, techniques, and risk of this surgery in detail with you and your wife  ABDOMINAL MASS, LUQ (LEFT UPPER QUADRANT) (R19.02) Impression: 1.5 x 2.0 smooth mass. Discrete. Left paracolic gutter. Splenule versus solitary lymph node. Low risk  MICROSCOPIC HEMATURIA (R31.29) Impression: May have passed a kidney stone on the left side  BMI 33.0-33.9,ADULT (Z68.33) UMBILICAL HERNIA, INCARCERATED (K42.0)     Fabian Coca M. Derrell Lolling, M.D., [facility] General and Minimally invasive Surgery Breast and Colorectal Surgery Office:   [phone] Pager:   [phone]

## 2018-04-20 ENCOUNTER — Encounter (HOSPITAL_COMMUNITY)
Admission: RE | Disposition: A | Discharge status: Home / Self Care | Payer: Self-pay | Source: Ambulatory Visit | Attending: General Surgery

## 2018-04-20 ENCOUNTER — Ambulatory Visit (HOSPITAL_COMMUNITY)
Admission: RE | Admit: 2018-04-20 | Discharge: 2018-04-20 | Disposition: A | Discharge status: Home / Self Care | Payer: Self-pay | Source: Ambulatory Visit | Attending: General Surgery | Admitting: General Surgery

## 2018-04-20 ENCOUNTER — Encounter (HOSPITAL_COMMUNITY): Payer: Self-pay | Admitting: General Practice

## 2018-04-20 ENCOUNTER — Ambulatory Visit (HOSPITAL_COMMUNITY): Payer: Self-pay | Admitting: Anesthesiology

## 2018-04-20 ENCOUNTER — Ambulatory Visit (HOSPITAL_COMMUNITY): Payer: Self-pay

## 2018-04-20 DIAGNOSIS — K420 Umbilical hernia with obstruction, without gangrene: Secondary | ICD-10-CM | POA: Insufficient documentation

## 2018-04-20 DIAGNOSIS — Z6832 Body mass index (BMI) 32.0-32.9, adult: Secondary | ICD-10-CM | POA: Insufficient documentation

## 2018-04-20 DIAGNOSIS — K8010 Calculus of gallbladder with chronic cholecystitis without obstruction: Secondary | ICD-10-CM | POA: Insufficient documentation

## 2018-04-20 DIAGNOSIS — Z419 Encounter for procedure for purposes other than remedying health state, unspecified: Secondary | ICD-10-CM

## 2018-04-20 DIAGNOSIS — K219 Gastro-esophageal reflux disease without esophagitis: Secondary | ICD-10-CM | POA: Insufficient documentation

## 2018-04-20 DIAGNOSIS — Q8909 Congenital malformations of spleen: Secondary | ICD-10-CM | POA: Insufficient documentation

## 2018-04-20 DIAGNOSIS — K8020 Calculus of gallbladder without cholecystitis without obstruction: Secondary | ICD-10-CM | POA: Diagnosis present

## 2018-04-20 DIAGNOSIS — N134 Hydroureter: Secondary | ICD-10-CM | POA: Insufficient documentation

## 2018-04-20 DIAGNOSIS — I10 Essential (primary) hypertension: Secondary | ICD-10-CM | POA: Insufficient documentation

## 2018-04-20 DIAGNOSIS — E669 Obesity, unspecified: Secondary | ICD-10-CM | POA: Insufficient documentation

## 2018-04-20 DIAGNOSIS — Z87891 Personal history of nicotine dependence: Secondary | ICD-10-CM | POA: Insufficient documentation

## 2018-04-20 HISTORY — PX: UMBILICAL HERNIA REPAIR: SHX196

## 2018-04-20 HISTORY — DX: Gastro-esophageal reflux disease without esophagitis: K21.9

## 2018-04-20 HISTORY — PX: CHOLECYSTECTOMY: SHX55

## 2018-04-20 HISTORY — DX: Unspecified osteoarthritis, unspecified site: M19.90

## 2018-04-20 LAB — GLUCOSE, CAPILLARY: GLUCOSE-CAPILLARY: 211 mg/dL — AB (ref 65–99)

## 2018-04-20 SURGERY — LAPAROSCOPIC CHOLECYSTECTOMY WITH INTRAOPERATIVE CHOLANGIOGRAM
Anesthesia: General | Site: Abdomen

## 2018-04-20 MED ORDER — SUGAMMADEX SODIUM 200 MG/2ML IV SOLN
INTRAVENOUS | Status: AC
  Filled 2018-04-20: qty 2

## 2018-04-20 MED ORDER — ROCURONIUM BROMIDE 100 MG/10ML IV SOLN
INTRAVENOUS | Status: DC | PRN
  Administered 2018-04-20: 50 mg via INTRAVENOUS
  Administered 2018-04-20: 20 mg via INTRAVENOUS

## 2018-04-20 MED ORDER — ONDANSETRON HCL 4 MG/2ML IJ SOLN
INTRAMUSCULAR | Status: AC
  Filled 2018-04-20: qty 2

## 2018-04-20 MED ORDER — ONDANSETRON 4 MG PO TBDP
4.0000 mg | ORAL_TABLET | Freq: Once | ORAL | Status: AC
  Administered 2018-04-20: 4 mg via ORAL

## 2018-04-20 MED ORDER — LACTATED RINGERS IR SOLN
Status: DC | PRN
  Administered 2018-04-20: 1000 mL

## 2018-04-20 MED ORDER — CEFAZOLIN SODIUM-DEXTROSE 2-4 GM/100ML-% IV SOLN
2.0000 g | INTRAVENOUS | Status: AC
  Administered 2018-04-20: 2 g via INTRAVENOUS
  Filled 2018-04-20: qty 100

## 2018-04-20 MED ORDER — ONDANSETRON HCL 4 MG/2ML IJ SOLN
INTRAMUSCULAR | Status: DC | PRN
  Administered 2018-04-20: 4 mg via INTRAVENOUS

## 2018-04-20 MED ORDER — IOPAMIDOL (ISOVUE-300) INJECTION 61%
INTRAVENOUS | Status: AC
  Filled 2018-04-20: qty 50

## 2018-04-20 MED ORDER — MIDAZOLAM HCL 5 MG/5ML IJ SOLN
INTRAMUSCULAR | Status: DC | PRN
  Administered 2018-04-20: 2 mg via INTRAVENOUS

## 2018-04-20 MED ORDER — CELECOXIB 200 MG PO CAPS
200.0000 mg | ORAL_CAPSULE | ORAL | Status: AC
  Administered 2018-04-20: 200 mg via ORAL
  Filled 2018-04-20: qty 1

## 2018-04-20 MED ORDER — LIDOCAINE 20MG/ML (2%) 15 ML SYRINGE OPTIME
INTRAMUSCULAR | Status: DC | PRN
  Administered 2018-04-20: 1.5 mg/kg/h via INTRAVENOUS

## 2018-04-20 MED ORDER — SUGAMMADEX SODIUM 200 MG/2ML IV SOLN
INTRAVENOUS | Status: DC | PRN
  Administered 2018-04-20: 200 mg via INTRAVENOUS

## 2018-04-20 MED ORDER — ACETAMINOPHEN 500 MG PO TABS
1000.0000 mg | ORAL_TABLET | ORAL | Status: AC
  Administered 2018-04-20: 1000 mg via ORAL
  Filled 2018-04-20: qty 2

## 2018-04-20 MED ORDER — MIDAZOLAM HCL 2 MG/2ML IJ SOLN
INTRAMUSCULAR | Status: AC
  Filled 2018-04-20: qty 2

## 2018-04-20 MED ORDER — HYDROMORPHONE HCL 1 MG/ML IJ SOLN
INTRAMUSCULAR | Status: AC
  Administered 2018-04-20: 0.5 mg via INTRAVENOUS
  Filled 2018-04-20: qty 1

## 2018-04-20 MED ORDER — BUPIVACAINE-EPINEPHRINE 0.5% -1:200000 IJ SOLN
INTRAMUSCULAR | Status: DC | PRN
  Administered 2018-04-20: 21 mL

## 2018-04-20 MED ORDER — PROPOFOL 10 MG/ML IV BOLUS
INTRAVENOUS | Status: AC
  Filled 2018-04-20: qty 20

## 2018-04-20 MED ORDER — IOPAMIDOL (ISOVUE-300) INJECTION 61%
INTRAVENOUS | Status: DC | PRN
  Administered 2018-04-20: 12 mL via URETHRAL

## 2018-04-20 MED ORDER — BUPIVACAINE-EPINEPHRINE (PF) 0.5% -1:200000 IJ SOLN
INTRAMUSCULAR | Status: AC
  Filled 2018-04-20: qty 30

## 2018-04-20 MED ORDER — 0.9 % SODIUM CHLORIDE (POUR BTL) OPTIME
TOPICAL | Status: DC | PRN
  Administered 2018-04-20: 1000 mL

## 2018-04-20 MED ORDER — FENTANYL CITRATE (PF) 250 MCG/5ML IJ SOLN
INTRAMUSCULAR | Status: AC
  Filled 2018-04-20: qty 5

## 2018-04-20 MED ORDER — MEPERIDINE HCL 50 MG/ML IJ SOLN: 6.2500 mg | INTRAMUSCULAR | Status: DC | PRN

## 2018-04-20 MED ORDER — LIDOCAINE HCL 2 % IJ SOLN
INTRAMUSCULAR | Status: AC
  Filled 2018-04-20: qty 20

## 2018-04-20 MED ORDER — LIDOCAINE HCL (CARDIAC) PF 100 MG/5ML IV SOSY
PREFILLED_SYRINGE | INTRAVENOUS | Status: DC | PRN
  Administered 2018-04-20: 60 mg via INTRAVENOUS

## 2018-04-20 MED ORDER — ACETAMINOPHEN 10 MG/ML IV SOLN: 1000.0000 mg | Freq: Once | INTRAVENOUS | Status: DC | PRN

## 2018-04-20 MED ORDER — HYDROCODONE-ACETAMINOPHEN 5-325 MG PO TABS: 1.0000 | ORAL_TABLET | Freq: Four times a day (QID) | ORAL | 0 refills | Status: AC | PRN

## 2018-04-20 MED ORDER — PROMETHAZINE HCL 25 MG/ML IJ SOLN: 6.2500 mg | INTRAMUSCULAR | Status: DC | PRN

## 2018-04-20 MED ORDER — KETAMINE HCL 10 MG/ML IJ SOLN
INTRAMUSCULAR | Status: AC
  Filled 2018-04-20: qty 1

## 2018-04-20 MED ORDER — HYDROCODONE-ACETAMINOPHEN 7.5-325 MG PO TABS: 1.0000 | ORAL_TABLET | Freq: Once | ORAL | Status: DC | PRN

## 2018-04-20 MED ORDER — LACTATED RINGERS IV SOLN
INTRAVENOUS | Status: DC | PRN
  Administered 2018-04-20 (×2): via INTRAVENOUS

## 2018-04-20 MED ORDER — CHLORHEXIDINE GLUCONATE CLOTH 2 % EX PADS: 6.0000 | MEDICATED_PAD | Freq: Once | CUTANEOUS | Status: DC

## 2018-04-20 MED ORDER — ONDANSETRON 4 MG PO TBDP
ORAL_TABLET | ORAL | Status: AC
  Filled 2018-04-20: qty 1

## 2018-04-20 MED ORDER — PROPOFOL 10 MG/ML IV BOLUS
INTRAVENOUS | Status: DC | PRN
  Administered 2018-04-20: 50 mg via INTRAVENOUS
  Administered 2018-04-20: 150 mg via INTRAVENOUS

## 2018-04-20 MED ORDER — KETAMINE HCL 10 MG/ML IJ SOLN
INTRAMUSCULAR | Status: DC | PRN
  Administered 2018-04-20: 35 mg via INTRAVENOUS

## 2018-04-20 MED ORDER — DEXAMETHASONE SODIUM PHOSPHATE 10 MG/ML IJ SOLN
INTRAMUSCULAR | Status: DC | PRN
  Administered 2018-04-20: 10 mg via INTRAVENOUS

## 2018-04-20 MED ORDER — HYDROMORPHONE HCL 1 MG/ML IJ SOLN
0.2500 mg | INTRAMUSCULAR | Status: DC | PRN
  Administered 2018-04-20 (×2): 0.5 mg via INTRAVENOUS

## 2018-04-20 MED ORDER — FENTANYL CITRATE (PF) 100 MCG/2ML IJ SOLN
INTRAMUSCULAR | Status: DC | PRN
  Administered 2018-04-20: 50 ug via INTRAVENOUS
  Administered 2018-04-20: 100 ug via INTRAVENOUS
  Administered 2018-04-20 (×2): 50 ug via INTRAVENOUS

## 2018-04-20 SURGICAL SUPPLY — 55 items
APPLIER CLIP ROT 10 11.4 M/L (STAPLE) ×2
BENZOIN TINCTURE PRP APPL 2/3 (GAUZE/BANDAGES/DRESSINGS) ×2 IMPLANT
BLADE HEX COATED 2.75 (ELECTRODE) ×2 IMPLANT
BLADE SURG SZ10 CARB STEEL (BLADE) ×4 IMPLANT
CABLE HIGH FREQUENCY MONO STRZ (ELECTRODE) ×2 IMPLANT
CLIP APPLIE ROT 10 11.4 M/L (STAPLE) ×1 IMPLANT
COVER MAYO STAND STRL (DRAPES) ×2 IMPLANT
COVER SURGICAL LIGHT HANDLE (MISCELLANEOUS) ×2 IMPLANT
DECANTER SPIKE VIAL GLASS SM (MISCELLANEOUS) ×2 IMPLANT
DERMABOND ADVANCED (GAUZE/BANDAGES/DRESSINGS) ×1
DERMABOND ADVANCED .7 DNX12 (GAUZE/BANDAGES/DRESSINGS) ×1 IMPLANT
DRAPE C-ARM 42X120 X-RAY (DRAPES) ×2 IMPLANT
DRAPE LAPAROTOMY T 102X78X121 (DRAPES) ×2 IMPLANT
DRAPE POUCH INSTRU U-SHP 10X18 (DRAPES) ×2 IMPLANT
ELECT PENCIL ROCKER SW 15FT (MISCELLANEOUS) ×2 IMPLANT
ELECT REM PT RETURN 15FT ADLT (MISCELLANEOUS) ×2 IMPLANT
GAUZE SPONGE 4X4 12PLY STRL (GAUZE/BANDAGES/DRESSINGS) IMPLANT
GLOVE BIOGEL PI IND STRL 7.0 (GLOVE) ×1 IMPLANT
GLOVE BIOGEL PI INDICATOR 7.0 (GLOVE) ×1
GLOVE EUDERMIC 7 POWDERFREE (GLOVE) ×2 IMPLANT
GOWN STRL REUS W/TWL LRG LVL3 (GOWN DISPOSABLE) ×2 IMPLANT
GOWN STRL REUS W/TWL XL LVL3 (GOWN DISPOSABLE) ×8 IMPLANT
HEMOSTAT SNOW SURGICEL 2X4 (HEMOSTASIS) IMPLANT
KIT BASIN OR (CUSTOM PROCEDURE TRAY) ×2 IMPLANT
NEEDLE HYPO 22GX1.5 SAFETY (NEEDLE) ×2 IMPLANT
NS IRRIG 1000ML POUR BTL (IV SOLUTION) ×2 IMPLANT
PACK BASIC VI WITH GOWN DISP (CUSTOM PROCEDURE TRAY) ×2 IMPLANT
POSITIONER SURGICAL ARM (MISCELLANEOUS) IMPLANT
POUCH RETRIEVAL ECOSAC 10 (ENDOMECHANICALS) IMPLANT
POUCH RETRIEVAL ECOSAC 10MM (ENDOMECHANICALS)
POUCH SPECIMEN RETRIEVAL 10MM (ENDOMECHANICALS) ×4 IMPLANT
SCISSORS LAP 5X35 DISP (ENDOMECHANICALS) ×2 IMPLANT
SET CHOLANGIOGRAPH MIX (MISCELLANEOUS) ×2 IMPLANT
SET IRRIG TUBING LAPAROSCOPIC (IRRIGATION / IRRIGATOR) ×2 IMPLANT
SLEEVE XCEL OPT CAN 5 100 (ENDOMECHANICALS) ×4 IMPLANT
SOL PREP POV-IOD 4OZ 10% (MISCELLANEOUS) IMPLANT
SPONGE LAP 4X18 RFD (DISPOSABLE) ×2 IMPLANT
STRIP CLOSURE SKIN 1/2X4 (GAUZE/BANDAGES/DRESSINGS) ×2 IMPLANT
SUT MNCRL AB 4-0 PS2 18 (SUTURE) ×4 IMPLANT
SUT NOVA NAB DX-16 0-1 5-0 T12 (SUTURE) ×2 IMPLANT
SUT PROLENE 0 CT 1 CR/8 (SUTURE) ×2 IMPLANT
SUT VIC AB 2-0 CT1 27 (SUTURE) ×1
SUT VIC AB 2-0 CT1 27XBRD (SUTURE) ×1 IMPLANT
SUT VIC AB 3-0 SH 18 (SUTURE) ×2 IMPLANT
SYR BULB IRRIGATION 50ML (SYRINGE) ×2 IMPLANT
SYR CONTROL 10ML LL (SYRINGE) ×2 IMPLANT
TAPE CLOTH 4X10 WHT NS (GAUZE/BANDAGES/DRESSINGS) IMPLANT
TOWEL OR 17X26 10 PK STRL BLUE (TOWEL DISPOSABLE) ×2 IMPLANT
TOWEL OR NON WOVEN STRL DISP B (DISPOSABLE) ×2 IMPLANT
TRAY LAPAROSCOPIC (CUSTOM PROCEDURE TRAY) ×2 IMPLANT
TROCAR BLADELESS OPT 5 100 (ENDOMECHANICALS) ×2 IMPLANT
TROCAR XCEL BLUNT TIP 100MML (ENDOMECHANICALS) ×2 IMPLANT
TROCAR XCEL NON-BLD 11X100MML (ENDOMECHANICALS) ×2 IMPLANT
TUBING INSUF HEATED (TUBING) ×2 IMPLANT
YANKAUER SUCT BULB TIP 10FT TU (MISCELLANEOUS) ×2 IMPLANT

## 2018-04-20 NOTE — Progress Notes (Signed)
PACU PHASE II, Pt is no longer clammy, denies pain and reports feeling better, ready for discharge home. Proceeding to discharge

## 2018-04-20 NOTE — Transfer of Care (Signed)
Immediate Anesthesia Transfer of Care Note  Patient: PATTERSON HOLLENBAUGH  Procedure(s) Performed: LAPAROSCOPIC CHOLECYSTECTOMY WITH INTRAOPERATIVE CHOLANGIOGRAM ERAS PATHWAY (N/A Abdomen) REPAIR OF INCARCERATED UMBILICAL HERNIA AND EXCISION OF OMNETAL MASS (N/A Abdomen)  Patient Location: PACU  Anesthesia Type:General  Level of Consciousness: sedated, patient cooperative and responds to stimulation  Airway & Oxygen Therapy: Patient Spontanous Breathing and Patient connected to face mask oxygen  Post-op Assessment: Report given to RN and Post -op Vital signs reviewed and stable  Post vital signs: Reviewed and stable  Last Vitals:  Vitals Value Taken Time  BP    Temp    Pulse    Resp    SpO2      Last Pain:  Vitals:   04/20/18 0704  TempSrc: Oral         Complications: No apparent anesthesia complications

## 2018-04-20 NOTE — Anesthesia Postprocedure Evaluation (Signed)
Anesthesia Post Note  Patient: Mark Warner  Procedure(s) Performed: LAPAROSCOPIC CHOLECYSTECTOMY WITH INTRAOPERATIVE CHOLANGIOGRAM ERAS PATHWAY (N/A Abdomen) REPAIR OF INCARCERATED UMBILICAL HERNIA AND EXCISION OF OMNETAL MASS (N/A Abdomen)     Patient location during evaluation: PACU Anesthesia Type: General Level of consciousness: awake Pain management: pain level controlled Vital Signs Assessment: post-procedure vital signs reviewed and stable Respiratory status: spontaneous breathing Cardiovascular status: stable Anesthetic complications: no    Last Vitals:  Vitals:   04/20/18 1245 04/20/18 1300  BP: (!) 145/91 133/85  Pulse: 62 64  Resp: 18 (!) 21  Temp:    SpO2: 97% 98%    Last Pain:  Vitals:   04/20/18 1130  TempSrc:   PainSc: 5                  Kirke Breach

## 2018-04-20 NOTE — Anesthesia Postprocedure Evaluation (Signed)
Anesthesia Post Note  Patient: Mark Warner  Procedure(s) Performed: LAPAROSCOPIC CHOLECYSTECTOMY WITH INTRAOPERATIVE CHOLANGIOGRAM ERAS PATHWAY (N/A Abdomen) REPAIR OF INCARCERATED UMBILICAL HERNIA AND EXCISION OF OMNETAL MASS (N/A Abdomen)     Patient location during evaluation: PACU Anesthesia Type: General Level of consciousness: awake Pain management: pain level controlled Vital Signs Assessment: post-procedure vital signs reviewed and stable Respiratory status: spontaneous breathing Cardiovascular status: stable Anesthetic complications: no    Last Vitals:  Vitals:   04/20/18 1100 04/20/18 1115  BP: (!) 150/92 (!) 149/88  Pulse: 79 74  Resp: 13 19  Temp:  36.4 C  SpO2: 94% 94%    Last Pain:  Vitals:   04/20/18 1100  TempSrc:   PainSc: 4                  [name]

## 2018-04-20 NOTE — Anesthesia Preprocedure Evaluation (Addendum)
Anesthesia Evaluation  Patient identified by MRN, date of birth, ID band Patient awake    Reviewed: Allergy & Precautions, NPO status , Patient's Chart, lab work & pertinent test results  Airway Mallampati: II  TM Distance: >3 FB     Dental   Pulmonary    breath sounds clear to auscultation       Cardiovascular hypertension,  Rhythm:Regular Rate:Normal     Neuro/Psych    GI/Hepatic GERD  ,  Endo/Other    Renal/GU      Musculoskeletal   Abdominal   Peds  Hematology   Anesthesia Other Findings   Reproductive/Obstetrics                             Anesthesia Physical Anesthesia Plan  ASA: III  Anesthesia Plan: General   Post-op Pain Management:    Induction: Intravenous  PONV Risk Score and Plan: Treatment may vary due to age or medical condition  Airway Management Planned: Oral ETT  Additional Equipment:   Intra-op Plan:   Post-operative Plan:   Informed Consent: I have reviewed the patients History and Physical, chart, labs and discussed the procedure including the risks, benefits and alternatives for the proposed anesthesia with the patient or authorized representative who has indicated his/her understanding and acceptance.   Dental advisory given  Plan Discussed with: CRNA and Anesthesiologist  Anesthesia Plan Comments:         Anesthesia Quick Evaluation

## 2018-04-20 NOTE — Op Note (Addendum)
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 Derrell Lolling, M.D., FACS  Assistant:                      Almond Lint, MD   Indication for Assistant: Complex exposure.  Complex technique.  Assistant indicated to reduce incidence of intraoperative and postoperative complications and to expedite case  Operative Indications:  This is a 52 year old man from [place]  He is referred by Dr. Lynnea Ferrier for evaluation of symptomatic gallstones.      For several months she's been having intermittent episodes of postprandial right upper quadrant pain. This will occur every 3-4 days. It is clearly related to meals and worse after fatty foods. Last a few hours and then goes away. He gets nausea but does not vomit. Gets some diarrhea which resolves.      He went to the emergency department on April 10. Lab work was normal. Urinalysis showed microscopic hematuria. CT scan showed gallstones which were obvious but no inflammation. Mild left hydroureter but no stones found. They theorized that he may have passed a stone. There was also a well-defined 1.5 x 2.0 soft tissue nodule in the left paracolic gutter. This seems to be floating in the fatty  tissue. They theorized this  was a benign splenule or an enlarged lymph node. They recommended clinical correlation. Follow-up CT in 3 months was recommended. Lymphoproliferative disorder was mentioned in the diagnosis. There was no other adenopathy or mass within the abdomen. I discussed all of these lab and x-ray findings with him. He also has an incarcerated umbilical hernia.       I advised him to undergo cholecystectomy and he completely agrees. I told him that we would repair his umbilical hernia primarily on the way out and that we would look in the left paracolic gutter and do an excisional biopsy of this mass if I could find it. I told him that it might be obscured and we might have to leave it alone and repeat CT in 3 months. He was comfortable with all of this. I have discussed indications, details, Mordecai Maes, numerous risk of the surgery with him. . He agrees with this plan.         Operative Findings:       Within the omentum on the left side of the abdomen there was a 2 cm smoothly rounded dark red nodule consistent with an accessory spleen.  This was resected.  No other nodules were seen.  The gallbladder was thin-walled but chronically inflamed and there were moderately extensive adhesions to the body and infundibulum of the gallbladder.  The anatomy of the cystic duct and cystic artery were conventional.  The cholangiogram was normal showing normal intrahepatic and extrahepatic biliary anatomy, no filling defects, and no obstruction with good flow of contrast into the duodenum.  He had an umbilical hernia with a defect about 2 cm.  There was a lot of incarcerated preperitoneal fat that had to be debrided.  The hernia was repaired primarily.  I felt that mesh was relatively contraindicated due to the spillage of bile and contamination for a class II-III wound.  Procedure in Detail:     Following the induction of general endotracheal anesthesia a surgical timeout was performed,  intravenous antibiotics were given, and the abdomen was prepped and draped in a sterile fashion.  0.5% Marcaine with epinephrine was used as a local infiltration anesthetic.     I started by making a transverse curvilinear incision at the lower rim of the umbilicus.  I dissected out the umbilical defect and undermined the subcutaneous tissue circumferentially.  I entered the abdominal cavity.  11 mm Hassan trocar was inserted and secured with 0 Vicryl sutures.  I had a good seal and pneumoperitoneum was created.  Video camera was inserted.  11 mm trochar was  placed in the subxiphoid region and two 5 mm trochars placed in the right upper quadrant.  I rotated the patient to the right      .  I mobilized the omentum and the small bowel and found what appeared to be a 2 cm accessory spleen.  This was elevated and resected with electrocautery.  It was placed in a specimen bag, removed, and sent to pathology.  Inspection revealed no other accessory spleens.       The patient was then positioned for cholecystectomy.  I identified the fundus of the gallbladder which was chronically inflamed.  I elevated that.  Some adhesions were taken down and we grasped the infundibulum.  We had some leakage of bile from the fundus which was evacuated.  We continued to dissect out the cystic duct and the cystic artery.  A cholangiogram catheter was inserted into the cystic duct and a cholangiogram was obtained  using the C arm.  The cholangiogram was normal as described above.  The cholangiogram catheter was removed.  The cystic duct was secured with multiple clips and divided.  I   Isolated the  anterior and posterior branches of the cystic artery, secured them with metal clips and divided them.  The gallbladder was dissected from its bed, placed in a specimen bag and removed.  The operative field was copiously irrigated both subhepatic and subphrenic spaces until all the irrigation fluid was clear.  Inspection of the operative bed  revealed no bleeding or bile leak.  4 quadrant inspection revealed no other abnormality or bleeding.  The pneumoperitoneum was released and the trocars were removed.  The umbilical fascial defect was closed transversely with 5 interrupted sutures of #1 Novofil.  Subcutaneous tissue was closed with 3-0 Vicryl sutures and the skin incisions were all closed with subcuticular 4-0 Monocryl and Dermabond.    Patient the patient tolerated procedure well was taken to PACU in stable condition.  EBL 20 cc.  Counts correct.  Complications none.     Addendum: I logged onto the [employer] and reviewed his prescription medication history          Rayce Brahmbhatt M. Derrell Lolling, M.D., FACS General and Minimally Invasive Surgery Breast and Colorectal Surgery  04/20/2018 10:22 AM

## 2018-04-20 NOTE — Discharge Instructions (Signed)
[facility] ______[facility] °LAPAROSCOPIC SURGERY: POST OP INSTRUCTIONS °Always review your discharge instruction sheet given to you by the facility where your surgery was performed. °IF YOU HAVE DISABILITY OR FAMILY LEAVE FORMS, YOU MUST BRING THEM TO THE OFFICE FOR PROCESSING.   °DO NOT GIVE THEM TO YOUR DOCTOR. ° °1. A prescription for pain medication may be given to you upon discharge.  Take your pain medication as prescribed, if needed.  If narcotic pain medicine is not needed, then you may take acetaminophen (Tylenol) or ibuprofen (Advil) as needed. °2. Take your usually prescribed medications unless otherwise directed. °3. If you need a refill on your pain medication, please contact your pharmacy.  They will contact our office to request authorization. Prescriptions will not be filled after 5pm or on week-ends. °4. You should follow a light diet the first few days after arrival home, such as soup and crackers, etc.  Be sure to include lots of fluids daily. °5. Most patients will experience some swelling and bruising in the area of the incisions.  Ice packs will help.  Swelling and bruising can take several days to resolve.  °6. It is common to experience some constipation if taking pain medication after surgery.  Increasing fluid intake and taking a stool softener (such as Colace) will usually help or prevent this problem from occurring.  A mild laxative (Milk of Magnesia or Miralax) should be taken according to package instructions if there are no bowel movements after 48 hours. °7. Unless discharge instructions indicate otherwise, you may remove your bandages 24-48 hours after surgery, and you may shower at that time.  You may have steri-strips (small skin tapes) in place directly over the incision.  These strips should be left on the skin for 7-10 days.  If your surgeon used skin glue on the incision, you may shower in 24 hours.  The glue will flake off over the next 2-3 weeks.  Any sutures or  staples will be removed at the office during your follow-up visit. °8. ACTIVITIES:  You may resume regular (light) daily activities beginning the next day--such as daily self-care, walking, climbing stairs--gradually increasing activities as tolerated.  You may have sexual intercourse when it is comfortable.  Refrain from any heavy lifting or straining until approved by your doctor. °a. You may drive when you are no longer taking prescription pain medication, you can comfortably wear a seatbelt, and you can safely maneuver your car and apply brakes. °b. RETURN TO WORK:  __________________________________________________________ °9. You should see your doctor in the office for a follow-up appointment approximately 2-3 weeks after your surgery.  Make sure that you call for this appointment within a day or two after you arrive home to insure a convenient appointment time. °10. OTHER INSTRUCTIONS: __________________________________________________________________________________________________________________________ __________________________________________________________________________________________________________________________ °WHEN TO CALL YOUR DOCTOR: °1. Fever over 101.0 °2. Inability to urinate °3. Continued bleeding from incision. °4. Increased pain, redness, or drainage from the incision. °5. Increasing abdominal pain ° °The clinic staff is available to answer your questions during regular business hours.  Please don’t hesitate to call and ask to speak to one of the nurses for clinical concerns.  If you have a medical emergency, go to the nearest emergency room or call 911.  A surgeon from [facility] is always on call at the hospital. °[address], [address], [place], [place]  [address] ? [address] [address], [place], [place]   [address] °[phone] ? [phone] ? FAX [phone] °Web site:   [email or web address] °

## 2018-04-20 NOTE — Addendum Note (Signed)
Addendum  created 04/20/18 1556 by Thornell Mule, CRNA   Charge Capture section accepted, Visit diagnoses modified

## 2018-04-20 NOTE — Anesthesia Procedure Notes (Signed)
Procedure Name: Intubation Date/Time: 04/20/2018 8:39 AM Performed by: Thornell Mule, CRNA Pre-anesthesia Checklist: Patient identified, Emergency Drugs available, Suction available and Patient being monitored Patient Re-evaluated:Patient Re-evaluated prior to induction Oxygen Delivery Method: Circle system utilized Preoxygenation: Pre-oxygenation with 100% oxygen Induction Type: IV induction Ventilation: Mask ventilation without difficulty Laryngoscope Size: Miller and 3 Grade View: Grade II Tube type: Oral Tube size: 7.5 mm Number of attempts: 1 Airway Equipment and Method: Stylet and Oral airway Placement Confirmation: ETT inserted through vocal cords under direct vision,  positive ETCO2 and breath sounds checked- equal and bilateral Secured at: 22 cm Tube secured with: Tape Dental Injury: Teeth and Oropharynx as per pre-operative assessment  Comments: Right upper incisor noted to be loose preop, dental advisory given.  Tooth intact and bleeding around gum line post intubation.

## 2018-04-20 NOTE — Progress Notes (Signed)
PACU< [place] II, pt has been  Profusely perspiring, nausea , denies pain nor shortness of breath. This RN administered 4 mg Zofran ODT per verbal order by Dr Chilton Si.  Dr Chilton Si at bedside assessing pt for his new condition, to start IV , administer fluids and monitor pt in phase II longer prior to discharge. Pt still denies pain , nausea relieved . Alert and oriented x 4. CBG checked to be 211

## 2018-04-20 NOTE — Interval H&P Note (Signed)
History and Physical Interval Note:  04/20/2018 7:33 AM  [employer]  has presented today for surgery, with the diagnosis of Gallstones, Umbilical Hernia  The various methods of treatment have been discussed with the patient and family. After consideration of risks, benefits and other options for treatment, the patient has consented to  Procedure(s): LAPAROSCOPIC CHOLECYSTECTOMY WITH INTRAOPERATIVE CHOLANGIOGRAM ERAS PATHWAY (N/A) UMBILICAL HERNIA REPAIR (N/A) as a surgical intervention .  The patient's history has been reviewed, patient examined, no change in status, stable for surgery.  I have reviewed the patient's chart and labs.  Questions were answered to the patient's satisfaction.     Ernestene Mention

## 2018-04-21 NOTE — Progress Notes (Signed)
Inform patient of Pathology report,. Tell him that the nodule we removed from his abdomen was a benign accessory spleen.  This is good news.

## 2018-04-29 ENCOUNTER — Telehealth: Payer: Self-pay | Admitting: Family Medicine

## 2018-04-29 MED ORDER — LISINOPRIL 20 MG PO TABS: 20.0000 mg | ORAL_TABLET | Freq: Every day | ORAL | 0 refills | Status: AC

## 2018-04-29 NOTE — Telephone Encounter (Signed)
Pt's BP: 135/88,157/88,153/88,141/90,149/87,141/76,139/88. Per Dr. Tanya Nones BP is too high add lisinopril  qd and recheck in ov in 1 month.   Med sent to pharm and pt aware via vm.

## 2018-10-24 IMAGING — CT CT RENAL STONE PROTOCOL
2 of 4 series · 15 of 46 positions shown, 17 images · non-contrast
Comparison: None.

CLINICAL DATA: Left flank pain.

EXAM:
CT ABDOMEN AND PELVIS WITHOUT CONTRAST
TECHNIQUE: Multidetector CT imaging of the abdomen and pelvis was performed
following the standard protocol without IV contrast.

[Series 2: axial st · axial · 0.83mm/px · z∈[+909,+1354]mm · 12 of 103 slices shown, 14 images]
[im 9/103  soft-tissue]
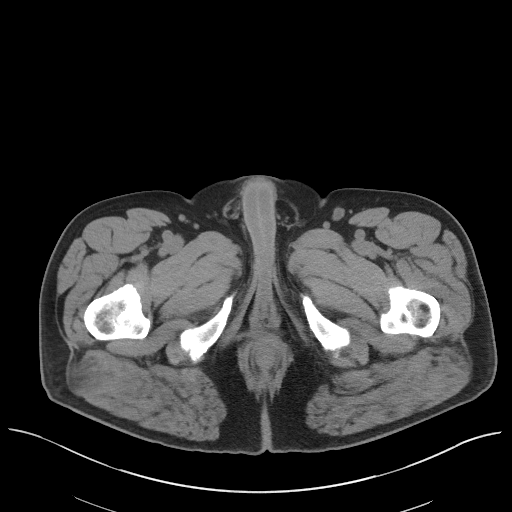
[im 9/103  bone]
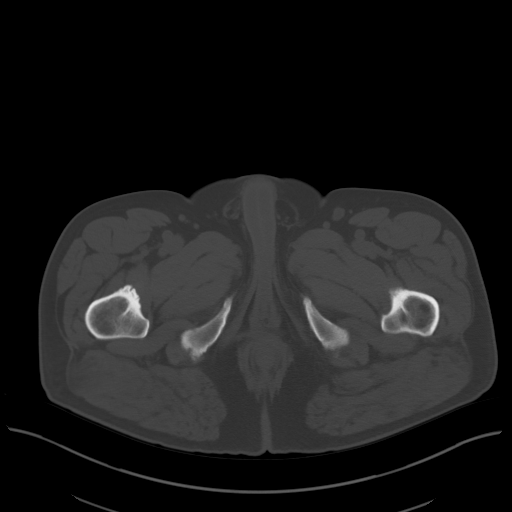
[im 17/103  soft-tissue]
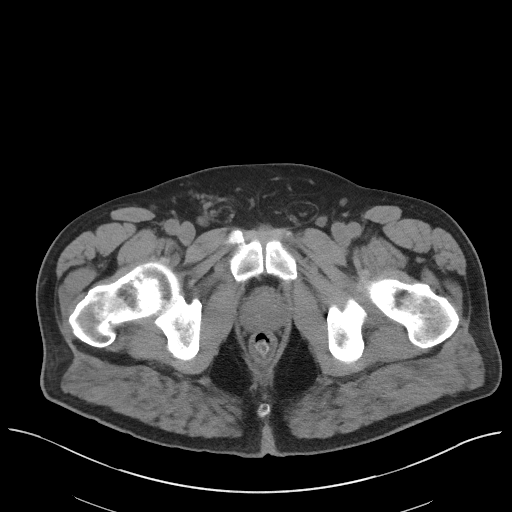
[im 25/103  soft-tissue]
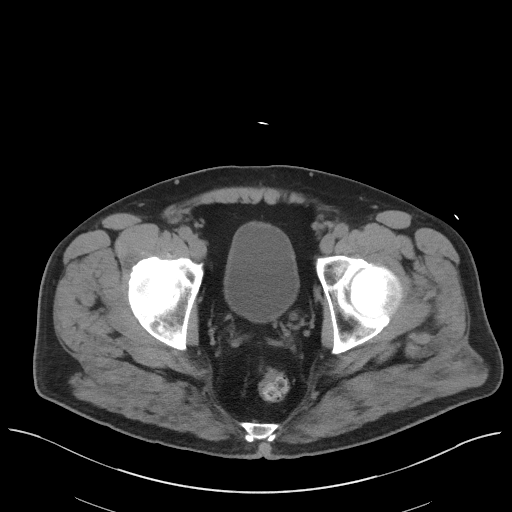
[im 33/103  soft-tissue]
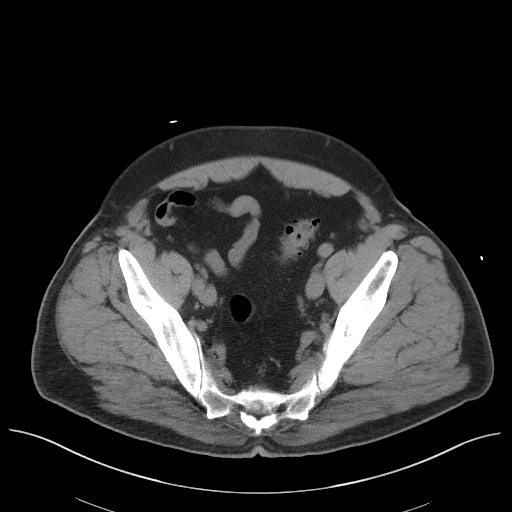
[im 41/103  soft-tissue]
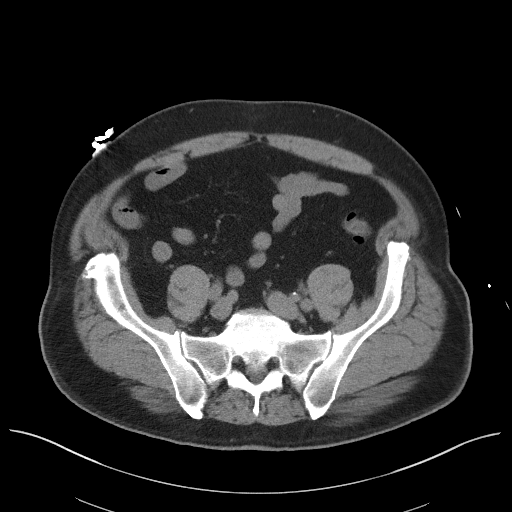
[im 49/103  soft-tissue]
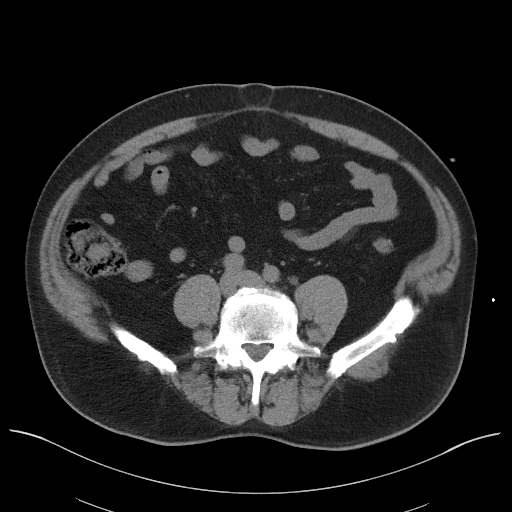
[im 58/103  soft-tissue]
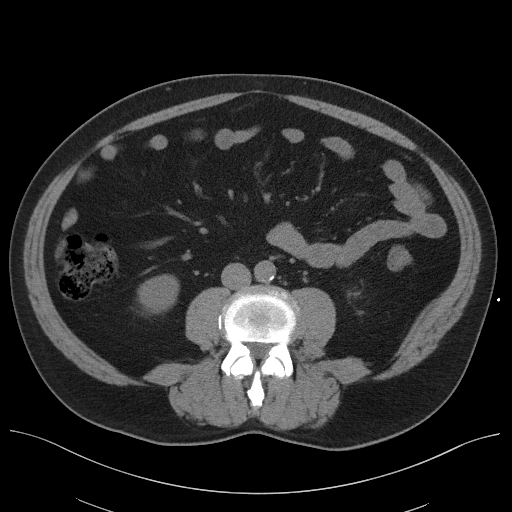
[im 66/103  soft-tissue]
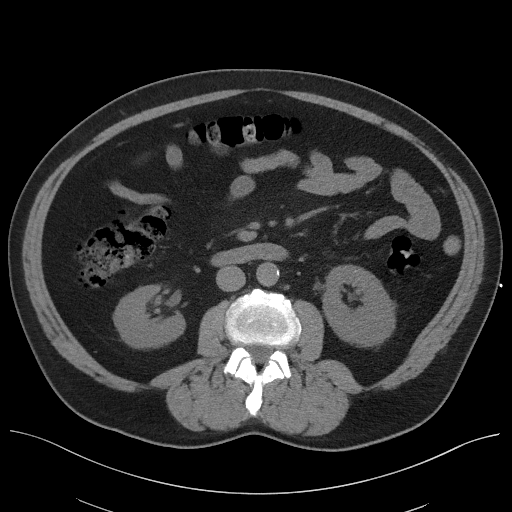
[im 74/103  soft-tissue]
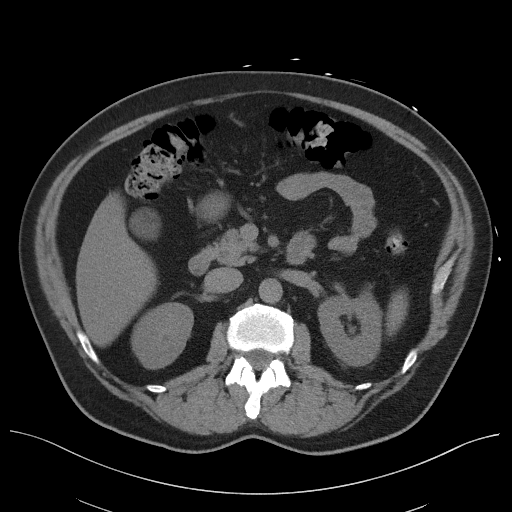
[im 74/103  bone]
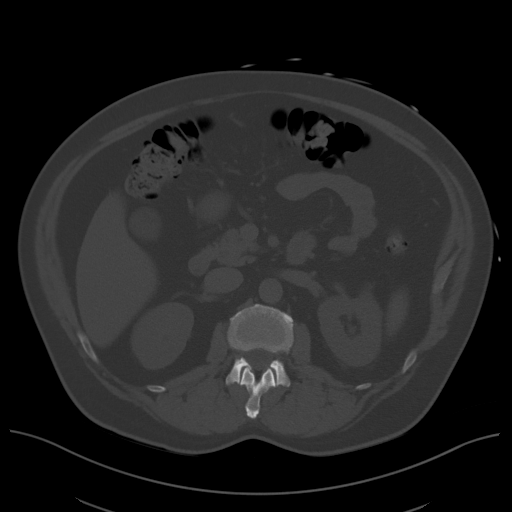
[im 82/103  soft-tissue]
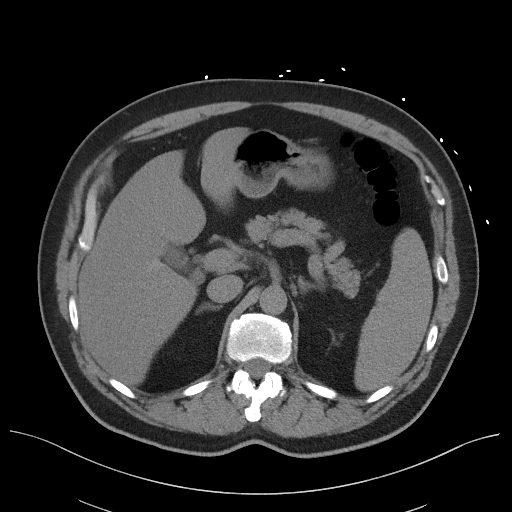
[im 90/103  soft-tissue]
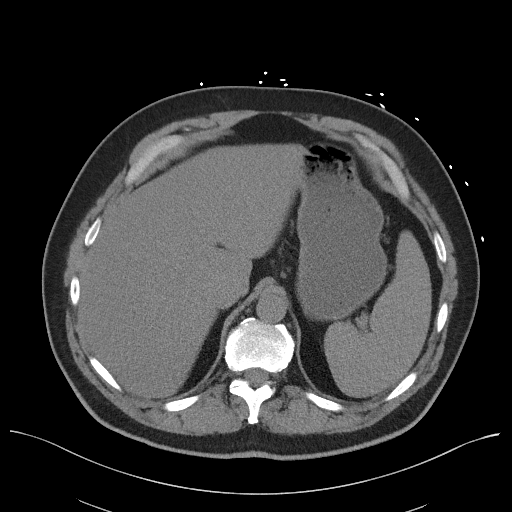
[im 98/103  soft-tissue]
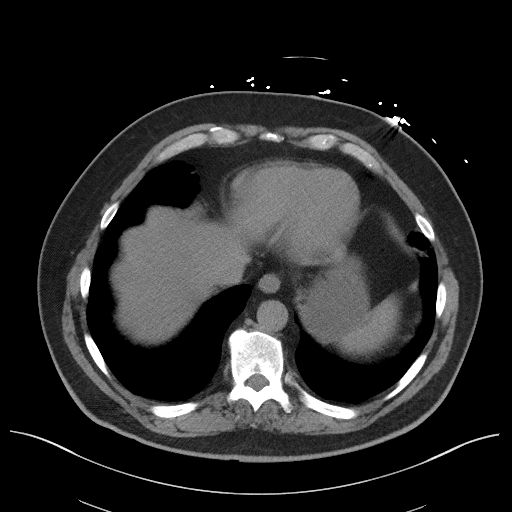

[Series 5: coronal st · coronal · 0.86mm/px · 3 of 101 slices shown]
[im 34/101  soft-tissue]
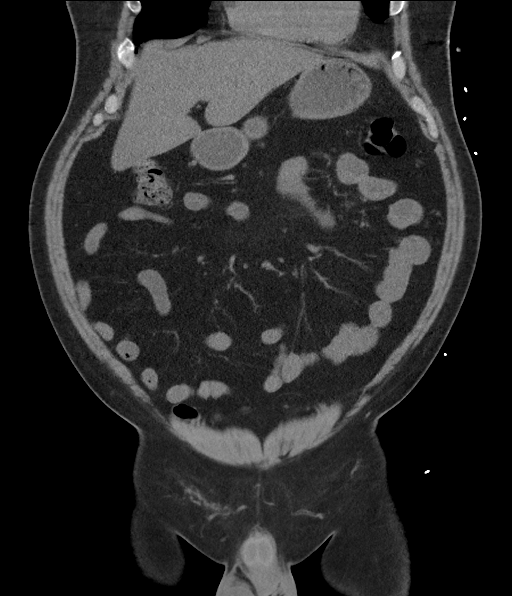
[im 45/101  soft-tissue]
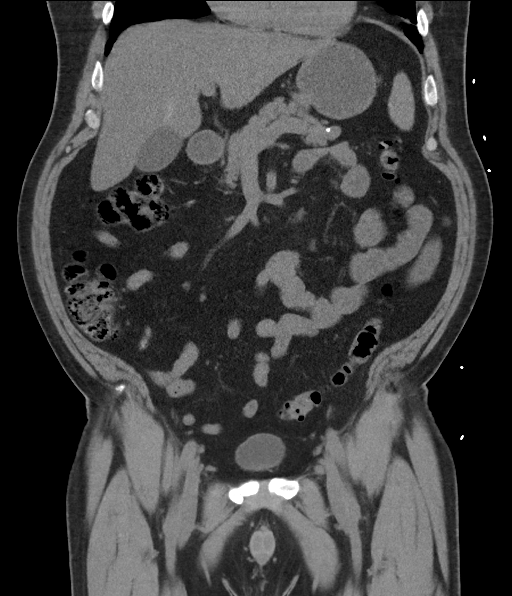
[im 56/101  soft-tissue]
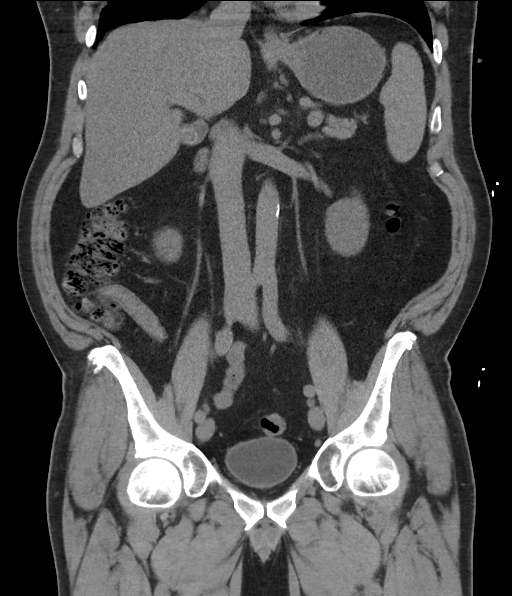

[15 of 46 positions shown; findings below may reference images not displayed]

FINDINGS: Lower chest: Minimal lingular atelectasis.  No consolidation.

Hepatobiliary: Decreased hepatic density consistent with steatosis.
Focal fatty sparing adjacent the gallbladder fossa. 2 peripherally
calcified gallstones within the gallbladder without pericholecystic
inflammation or gallbladder wall thickening. No biliary dilatation.

Pancreas: Few calcifications in the distal pancreatic body and tail.
No ductal dilatation or inflammation.

Spleen: Mild splenomegaly with spleen measuring 15.3 cm AP.

Adrenals/Urinary Tract: Mild left adrenal thickening. No adrenal
nodule. Mild left hydroureteronephrosis, the ureter is dilated to
the urinary bladder. No calcified stone or cause for obstruction.
Mild left perinephric edema. No right hydronephrosis or hydroureter.
No nephrolithiasis. Urinary bladder is partially distended without
stone or wall thickening. No urethral stones.

Stomach/Bowel: Stomach distended with fluid/ingested contents. No
gastric wall thickening. No bowel obstruction, inflammatory change
or wall thickening. Air and possible prior enteric contrast in the
appendix without appendicitis. Moderate proximal colonic stool
burden. Mild diverticulosis of the distal colon without
diverticulitis.

Vascular/Lymphatic: Mild aortic atherosclerosis without aneurysm.
Few prominent portacaval nodes are likely reactive. There is a 1.5 x
2.0 cm soft tissue nodule in the left pericolic gutter, axial image
39 series 2. No retroperitoneal or pelvic adenopathy.

Reproductive: Prostate is unremarkable.

Other: Small fat containing umbilical hernia. Fat in both inguinal
canals. No ascites or free air.

Musculoskeletal: There are no acute or suspicious osseous
abnormalities.
IMPRESSION: 1. Mild left hydroureteronephrosis without visualized stone or cause
for obstruction. Findings may be secondary to non radiopaque stone,
recently passed stone or pyelonephritis.
2. Well defined soft tissue nodule in the left pericolic gutter
measuring 1.5 x 2 cm. This may be a splenule as this is similar
density to the spleen versus an enlarged lymph node. No other
adenopathy in the abdomen or pelvis. No prior exams available for
comparison. Recommend clinical and laboratory evaluation for
lymphoproliferative disorder. In the absence of malignancy history,
recommend follow-up CT in 3 months.
3. Incidental findings of cholelithiasis. Mild colonic
diverticulosis. No diverticulitis. Aortic Atherosclerosis
([id number]).

## 2024-07-25 DIAGNOSIS — Z419 Encounter for procedure for purposes other than remedying health state, unspecified: Secondary | ICD-10-CM | POA: Diagnosis not present

## 2024-08-07 DIAGNOSIS — I10 Essential (primary) hypertension: Secondary | ICD-10-CM | POA: Diagnosis not present

## 2024-08-07 DIAGNOSIS — K219 Gastro-esophageal reflux disease without esophagitis: Secondary | ICD-10-CM | POA: Diagnosis not present

## 2024-08-25 DIAGNOSIS — Z419 Encounter for procedure for purposes other than remedying health state, unspecified: Secondary | ICD-10-CM | POA: Diagnosis not present

## 2024-09-22 NOTE — Congregational Nurse Program (Signed)
 Attempted wellness call to [facility] client by Caldwell GAILS MSW [facility] intern.   Wife answered and stated she would have her spouse to call back.  PCP [facility] Last Seen: 08/07/24 Next appointment 10/09/24 at 1:30PM  Avelina JONELLE Skeen RN [facility]

## 2024-09-29 NOTE — Congregational Nurse Program (Signed)
 Attempted call for follow up and Case management by Caldwell ROCKFORD MSW intern today for [facility] client.  Left message with wife to ask client to return call to [facility] staff.    Avelina JONELLE Skeen RN [employer]

## 2024-10-09 DIAGNOSIS — I10 Essential (primary) hypertension: Secondary | ICD-10-CM | POA: Diagnosis not present

## 2024-10-09 DIAGNOSIS — K219 Gastro-esophageal reflux disease without esophagitis: Secondary | ICD-10-CM | POA: Diagnosis not present

## 2024-11-13 DIAGNOSIS — I10 Essential (primary) hypertension: Secondary | ICD-10-CM | POA: Diagnosis not present

## 2024-11-13 DIAGNOSIS — K219 Gastro-esophageal reflux disease without esophagitis: Secondary | ICD-10-CM | POA: Diagnosis not present
